# Patient Record
Sex: Male | Born: 1960 | Race: White | Hispanic: No | Marital: Married | Smoking: Former smoker
Health system: Southern US, Community
[De-identification: ages and names within clinical notes are randomized; demographics above are authoritative.]

## PROBLEM LIST (undated history)

## (undated) DIAGNOSIS — E785 Hyperlipidemia, unspecified: Secondary | ICD-10-CM

## (undated) DIAGNOSIS — K449 Diaphragmatic hernia without obstruction or gangrene: Secondary | ICD-10-CM

## (undated) DIAGNOSIS — E119 Type 2 diabetes mellitus without complications: Secondary | ICD-10-CM

## (undated) DIAGNOSIS — R0600 Dyspnea, unspecified: Secondary | ICD-10-CM

## (undated) DIAGNOSIS — D649 Anemia, unspecified: Secondary | ICD-10-CM

## (undated) DIAGNOSIS — G4730 Sleep apnea, unspecified: Secondary | ICD-10-CM

## (undated) DIAGNOSIS — R262 Difficulty in walking, not elsewhere classified: Secondary | ICD-10-CM

## (undated) DIAGNOSIS — K219 Gastro-esophageal reflux disease without esophagitis: Secondary | ICD-10-CM

## (undated) DIAGNOSIS — M1990 Unspecified osteoarthritis, unspecified site: Secondary | ICD-10-CM

## (undated) DIAGNOSIS — I10 Essential (primary) hypertension: Secondary | ICD-10-CM

## (undated) HISTORY — DX: Anemia, unspecified: D64.9

## (undated) HISTORY — PX: MOUTH SURGERY: SHX715

## (undated) HISTORY — DX: Unspecified osteoarthritis, unspecified site: M19.90

## (undated) HISTORY — DX: Dyspnea, unspecified: R06.00

## (undated) HISTORY — DX: Difficulty in walking, not elsewhere classified: R26.2

---

## 1997-06-07 ENCOUNTER — Emergency Department
Admit: 1997-06-07 | Disposition: A | Discharge status: Home / Self Care | Payer: Self-pay | Admitting: Emergency Medicine

## 2003-07-28 ENCOUNTER — Inpatient Hospital Stay
Admission: EM | Admit: 2003-07-28 | Disposition: A | Discharge status: Home / Self Care | Payer: Self-pay | Source: Emergency Department | Admitting: Surgery

## 2005-03-21 ENCOUNTER — Ambulatory Visit (INDEPENDENT_AMBULATORY_CARE_PROVIDER_SITE_OTHER): Admit: 2005-03-21 | Disposition: A | Discharge status: Home / Self Care | Payer: Self-pay | Source: Ambulatory Visit

## 2005-12-15 ENCOUNTER — Ambulatory Visit (INDEPENDENT_AMBULATORY_CARE_PROVIDER_SITE_OTHER): Admit: 2005-12-15 | Disposition: A | Discharge status: Home / Self Care | Payer: Self-pay | Source: Ambulatory Visit

## 2006-10-03 HISTORY — PX: CARDIAC CATHETERIZATION: SHX172

## 2006-11-30 ENCOUNTER — Ambulatory Visit
Admission: RE | Admit: 2006-11-30 | Disposition: A | Discharge status: Home / Self Care | Payer: Self-pay | Source: Ambulatory Visit | Admitting: Cardiovascular Disease

## 2006-11-30 LAB — CBC WITH AUTO DIFFERENTIAL CERNER
Basophils Absolute: 0.1 /mm3 (ref 0.0–0.2)
Basophils: 1 % (ref 0–2)
Eosinophils Absolute: 0.1 /mm3 (ref 0.0–0.7)
Eosinophils: 1 % (ref 0–5)
Granulocytes Absolute: 4.9 /mm3 (ref 1.8–8.1)
Hematocrit: 41.7 % — ABNORMAL LOW (ref 42.0–52.0)
Hgb: 14.5 G/DL (ref 13.0–17.0)
Lymphocytes Absolute: 2.7 /mm3 (ref 0.5–4.4)
Lymphocytes: 33 % (ref 15–41)
MCH: 32.5 PG — ABNORMAL HIGH (ref 28.0–32.0)
MCHC: 34.9 G/DL (ref 32.0–36.0)
MCV: 93.2 FL (ref 80.0–100.0)
MPV: 8.4 FL (ref 7.4–10.4)
Monocytes Absolute: 0.4 /mm3 (ref 0.0–1.2)
Monocytes: 5 % (ref 0–11)
Neutrophils %: 60 % (ref 52–75)
Platelets: 208 /mm3 (ref 140–400)
RBC: 4.47 /mm3 — ABNORMAL LOW (ref 4.70–6.00)
RDW: 13.2 % (ref 11.5–15.0)
WBC: 8.2 /mm3 (ref 3.5–10.8)

## 2006-12-01 LAB — CBC- CERNER
Hematocrit: 43.1 % (ref 42.0–52.0)
Hgb: 15.1 G/DL (ref 13.0–17.0)
MCH: 32.5 PG — ABNORMAL HIGH (ref 28.0–32.0)
MCHC: 34.9 G/DL (ref 32.0–36.0)
MCV: 93.1 FL (ref 80.0–100.0)
MPV: 8.7 FL (ref 7.4–10.4)
Platelets: 201 /mm3 (ref 140–400)
RBC: 4.63 /mm3 — ABNORMAL LOW (ref 4.70–6.00)
RDW: 13 % (ref 11.5–15.0)
WBC: 7.3 /mm3 (ref 3.5–10.8)

## 2006-12-01 LAB — BASIC METABOLIC PANEL
BUN: 10 mg/dL (ref 8–20)
CO2: 25 mEq/L (ref 21–30)
Calcium: 9 mg/dL (ref 8.6–10.2)
Chloride: 106 mEq/L (ref 98–107)
Creatinine: 0.7 mg/dL (ref 0.6–1.5)
Glucose: 82 mg/dL (ref 70–100)
Potassium: 3.8 mEq/L (ref 3.6–5.0)
Sodium: 142 mEq/L (ref 136–146)

## 2006-12-01 LAB — GFR

## 2006-12-01 LAB — PT/INR
PT INR: 1 {INR} (ref 0.9–1.1)
PT: 11.5 s (ref 10.8–13.3)

## 2006-12-02 ENCOUNTER — Emergency Department: Admit: 2006-12-02 | Payer: Self-pay | Source: Emergency Department

## 2007-01-16 DIAGNOSIS — E782 Mixed hyperlipidemia: Secondary | ICD-10-CM | POA: Insufficient documentation

## 2007-01-16 DIAGNOSIS — I219 Acute myocardial infarction, unspecified: Secondary | ICD-10-CM | POA: Insufficient documentation

## 2007-10-04 DIAGNOSIS — I219 Acute myocardial infarction, unspecified: Secondary | ICD-10-CM

## 2007-10-04 HISTORY — DX: Acute myocardial infarction, unspecified: I21.9

## 2008-03-20 DIAGNOSIS — I2510 Atherosclerotic heart disease of native coronary artery without angina pectoris: Secondary | ICD-10-CM | POA: Insufficient documentation

## 2009-04-03 ENCOUNTER — Ambulatory Visit
Admit: 2009-04-03 | Disposition: A | Discharge status: Home / Self Care | Payer: Self-pay | Source: Ambulatory Visit | Admitting: Family Medicine

## 2009-10-22 DIAGNOSIS — N529 Male erectile dysfunction, unspecified: Secondary | ICD-10-CM | POA: Insufficient documentation

## 2009-10-22 DIAGNOSIS — K219 Gastro-esophageal reflux disease without esophagitis: Secondary | ICD-10-CM | POA: Insufficient documentation

## 2011-07-09 LAB — ECG 12-LEAD
Atrial Rate: 70 {beats}/min
Atrial Rate: 71 {beats}/min
P Axis: 12 degrees
P Axis: 14 degrees
P-R Interval: 168 ms
P-R Interval: 178 ms
Q-T Interval: 410 ms
Q-T Interval: 412 ms
QRS Duration: 102 ms
QRS Duration: 110 ms
QTC Calculation (Bezet): 444 ms
QTC Calculation (Bezet): 445 ms
R Axis: 30 degrees
R Axis: 40 degrees
T Axis: 36 degrees
T Axis: 42 degrees
Ventricular Rate: 70 {beats}/min
Ventricular Rate: 71 {beats}/min

## 2011-07-21 NOTE — Op Note (Unsigned)
DATE OF BIRTH:                        28-May-1961      ADMISSION DATE:                     11/30/2006            PATIENT LOCATION:                     [facility]            DATE OF PROCEDURE:                   11/30/2006      SURGEON:                            Mardene Celeste, MD      ASSISTANT(S):                  PREOPERATIVE DIAGNOSIS:            POSTOPERATIVE DIAGNOSIS:            PROCEDURE:            INDICATIONS FOR PROCEDURE:   The patient is a 50 year old gentleman who      presented to the [facility] on 11/29/2006 with a stuttering chest pain      presentation which was associated with diffuse S-T abnormalities and      positive troponin.  The patient underwent echocardiography documenting      preserved left ventricular systolic function and was treated aggressively      with integrelin, nitroglycerin, beta blockade, and aspirin.  The patient      was also treated with Lovenox and transferred to the [facility]      on the morning of 11/30/2006 for definition of his anatomy and      consideration of coronary intervention.  The patient has a history of      possible sleep apnea, hypertension untreated, and tobacco abuse.            DESCRIPTION OF PROCEDURE:  The patient was premedicated and brought to the      cardiac catheterization laboratory.  The right inguinal area was prepped      and draped.  Utilizing aseptic technique and a single needle puncture      approach, a 6-French sheath was introduced into the right femoral artery.      A Judkins 4L 6-French left coronary atrial catheter was introduced over a      guide wire and placed proximally to engage the left main for definitive      arteriography of this system.  This catheter was removed and in a similar      fashion a 6-French Williams catheter was introduced over a guide wire and      placed proximally to engage the right main for definitive arteriography of      this system.  This catheter was removed and the study reviewed.             The right coronary artery was a large, dominant vessel coursing within the      arteriovenous groove, giving rise distally to a posterior descending branch      and posterior ventricular arcade.  It was free of significant obstructive      disease.  Left main.  This vessel was of normal length and dimensions.  It was free      of significant obstructive disease.   It gives rise to a left anterior      descending, a ramus intermedius, and a circumflex.            The LAD is a moderately sized vessel coursing down the intraventricular      groove toward the apex.  It and its ramifications are free of significant      obstructive disease  the ramus intermedius was a moderately sized vessel      coursing over the anterolateral portion of the left ventricle, and it too      was free of significant obstructive disease.  The circumflex was on      dominant but large, consisting of an arteriovenous groove branch and one      large posterolateral branch.  This vessel was noteworthy for a culprit 80%      lesion in the mid third of the vessel.            Ventriculography was not performed.            Based on the above findings, it was thought prudent to attempt intervention      upon the culprit lesion in this gentleman's non dominant circumflex.  An      additional integrelin bolus was administered and an FL4, 6-French coronary      guide was introduced and utilized to cannulate the left main.  A Prowater      Asahi wire was passed down the circumflex and over it a Maverick 15 x 3.0      angioplasty balloon was taken and deployed twice along the course of the      lesion, improving luminal flow.  This was removed and a Liberte 4.0 x 20 mm      bare metal stent was taken and deployed along the course of the lesion and      dilated on two occasions to nominal atmospheres and then to 12 atmospheres      for an effective cross section diameter of 4.24 mm.  This produced an      excellent angiographic result  and was well tolerated by the patient.            The patient underwent Angio-Seal closure and was transferred back to the      [facility] in stable condition.            FINAL IMPRESSION:   Successful placement of a non drug eluting stent/bare      metal stent, a Liberte 4.0 x 20 mm, to the mid circumflex.  This stent was      taken to 4.24 mm with an excellent angiographic result.  No other      significant lesions were noted in his coronary tree.                                          ___________________________________          Date Signed: __________      Mardene Celeste, MD  ([id number])            D: 11/30/2006 by Mardene Celeste, MD      T: 11/30/2006 by [id number] ([id number]) ([id number])      cc:  Sylvie Farrier, MD

## 2013-07-05 DIAGNOSIS — J309 Allergic rhinitis, unspecified: Secondary | ICD-10-CM | POA: Insufficient documentation

## 2016-02-08 DIAGNOSIS — E559 Vitamin D deficiency, unspecified: Secondary | ICD-10-CM | POA: Insufficient documentation

## 2016-03-26 ENCOUNTER — Ambulatory Visit: Payer: BC Managed Care – PPO

## 2016-03-26 NOTE — Pre-Procedure Instructions (Signed)
   Procedure Verified   Pt ID verified   Arrival time at 1100 w read back , parking at [employer] or [facility], check in at surgery center, pt read back   Nothing to eat and drink after midnight, no chewing gum, mints, candy, ice pt read back   Pt had labs done 03/22/16 at [facility]- spoke to [place] will fax to [phone]   Pt states he had EKG done at PCP office- spoke to [place] will fax to [phone]   Pt states he had sleep study done a few months ago, could not remember name of facility. Requested sleep study results from PCP, Melissa could no verify if they had a copy   Ride arranged with wife

## 2016-03-29 NOTE — Pre-Procedure Instructions (Addendum)
   Labs wdl 03-22-16 cbc,cmp,pt/inr. BS 101.   Ekg 6.17.2017 wdl.   Called pmd-office closed   Lvm pt for last 2 home BS to lm on 3114.   Need home BS and A1C.

## 2016-03-31 NOTE — Pre-Procedure Instructions (Signed)
Patient called in to the 3114 and left a message that he does not do accuchecks at home since he is "boarderline"  Last A1C was 6.4.

## 2016-04-14 ENCOUNTER — Encounter: Payer: Self-pay | Admitting: Anesthesiology

## 2016-04-15 ENCOUNTER — Encounter
Admission: RE | Disposition: A | Discharge status: Home / Self Care | Payer: Self-pay | Source: Ambulatory Visit | Attending: Otolaryngology

## 2016-04-15 ENCOUNTER — Ambulatory Visit: Payer: BC Managed Care – PPO | Admitting: Otolaryngology

## 2016-04-15 ENCOUNTER — Ambulatory Visit
Admission: RE | Admit: 2016-04-15 | Discharge: 2016-04-15 | Disposition: A | Discharge status: Home / Self Care | Payer: BC Managed Care – PPO | Source: Ambulatory Visit | Attending: Otolaryngology | Admitting: Otolaryngology

## 2016-04-15 HISTORY — DX: Essential (primary) hypertension: I10

## 2016-04-15 HISTORY — DX: Hyperlipidemia, unspecified: E78.5

## 2016-04-15 HISTORY — DX: Diaphragmatic hernia without obstruction or gangrene: K44.9

## 2016-04-15 HISTORY — DX: Gastro-esophageal reflux disease without esophagitis: K21.9

## 2016-04-15 HISTORY — DX: Type 2 diabetes mellitus without complications: E11.9

## 2016-04-15 HISTORY — DX: Sleep apnea, unspecified: G47.30

## 2016-04-15 SURGERY — ENDOSCOPY NASAL
Anesthesia: Anesthesia General

## 2016-04-15 SURGICAL SUPPLY — 51 items
BANDAGE STERI-STRIP 0.5X4IN (Dressing) ×1
BLADE INFERIOR TURBINATE 2MM (ENT Supplies) ×1
BLADE SHAVER OD2 MM STRAIGHT SHAFT (ENT Supplies) ×1
BLADE SHAVER OD2 MM STRAIGHT SHAFT ELEVATOR IRRIGATION TUBE (ENT Supplies) ×1 IMPLANT
BLADE SURG SAFETYLOCK STRL 15 (Blade) ×2 IMPLANT
COAGULATOR HANDSWITCHNG 8F 6IN (Cautery) ×1
COAGULATOR SUCTION L6 IN HANDSWITCH CORD (Cautery) ×1
COAGULATOR SUCTION L6 IN HANDSWITCH CORD THERMAL REDUCTION FLUID (Cautery) ×1 IMPLANT
DRESSING SURG PATTY .5X3IN (Sponge) ×2 IMPLANT
GLOVE SURG BIOGEL SENSE SZ 7.5 (Glove) ×4 IMPLANT
GLOVE SURG BIOGEL SZ7.5 (Glove) ×4 IMPLANT
HRST DONUT HL-IN-ONE (Positioning Supplies) ×2 IMPLANT
INACTIVE USE LAWSON 120900 (Gown) ×2 IMPLANT
NEEDLE 27G X 1/2" (Needles) ×2 IMPLANT
PACK HEAD AND NECK (Drape) ×2
PACK SRG LF HD NK (Drape) ×2
PACK SURGICAL HEAD NECK CONVERTORS (Drape) ×2 IMPLANT
PAD ARMBOARD 20X8X2IN (Procedure Accessories) ×2 IMPLANT
PAD ELECTROSRG GRND REM W CRD (Procedure Accessories) ×2 IMPLANT
SOL NACL .9% 500ML LATEX (IV Solutions) ×1
SOL NACL .9% IRRIG 250ML NLTX (IV Solutions) ×1
SOL NACL.9% 1000ML IRR NONLTX (Irrigation Solutions) ×1
SOLUTION IRRIGATION 0.9% SODIUM CHLORIDE (IV Solutions) ×1
SOLUTION IRRIGATION 0.9% SODIUM CHLORIDE (Irrigation Solutions) ×1
SOLUTION IRRIGATION 0.9% SODIUM CHLORIDE 1000 ML PLASTIC POUR BOTTLE (Irrigation Solutions) ×1 IMPLANT
SOLUTION IRRIGATION 0.9% SODIUM CHLORIDE 250 ML PLASTIC POUR BOTTLE (IV Solutions) ×1 IMPLANT
SOLUTION IV 0.9% SODIUM CHLORIDE 500 ML (IV Solutions) ×1
SOLUTION IV 0.9% SODIUM CHLORIDE 500 ML PLASTIC CONTAINER (IV Solutions) ×1 IMPLANT
SPLINT DOYLE II INTRANASAL (Splint) ×2 IMPLANT
SPLINT NASAL L3.5 CM X H2 CM THK.6 CM (Sponge) ×1
SPLINT NASAL L3.5 CM X H2 CM THK.6 CM KENNEDY SEPTUM COMPRESSED (Sponge) ×1 IMPLANT
SPONGE MEROCEL KENNDY 3.5CM (Sponge) ×1
STRIP SKIN CLOSURE L4 IN X W1/2 IN (Dressing) ×1
STRIP SKIN CLOSURE L4 IN X W1/2 IN REINFORCE STERI-STRIP POLYESTER (Dressing) ×1 IMPLANT
SUTURE CHROMIC 4-0 P3 18IN (Suture) ×1
SUTURE CHROMIC 4-0 SC1 SC118IN (Suture)
SUTURE CHROMIC GUT CHROMIC 4-0 P-3 L18 (Suture) ×1 IMPLANT
SUTURE ETHILON 3-0 PS1 18IN (Suture) ×1
SUTURE ETHILON BLACK 3-0 PS-1 L18 IN (Suture) ×1
SUTURE ETHILON BLACK 3-0 PS-1 L18 IN MONOFILAMENT NONABSORBABLE (Suture) ×1 IMPLANT
SUTURE PLAIN GUT PLAIN 4-0 SC-1 L18 IN 2 (Suture)
SUTURE PLAIN GUT PLAIN 4-0 SC-1 L18 IN 2 ARM MONOFILAMENT YELLOWISH (Suture) IMPLANT
SYRINGE BULB 60CC LATEX FREE (Syringes, Needles) ×2 IMPLANT
SYRINGE EAR/ULCER GREEN 3 OZ (Syringes, Needles) ×2 IMPLANT
TOWEL STERILE 6-PACK (Procedure Accessories) ×2 IMPLANT
TRAY BASIN MINOR (Tray) ×2 IMPLANT
TRAY NASAL/ORAL/H (Tray) ×2 IMPLANT
TUBE CULTURE STURTS LIQ BBL (Procedure Accessories) ×4 IMPLANT
TUBING CONNECTING STERILE 10FT (Tubing) ×2
TUBING SUCTION ID3/16 IN L10 FT (Tubing) ×2
TUBING SUCTION ID3/16 IN L10 FT NONCONDUCTIVE STRAIGHT MALE FEMALE (Tubing) ×2 IMPLANT

## 2016-04-29 ENCOUNTER — Ambulatory Visit: Payer: BC Managed Care – PPO

## 2016-04-29 DIAGNOSIS — I119 Hypertensive heart disease without heart failure: Secondary | ICD-10-CM | POA: Insufficient documentation

## 2016-04-29 DIAGNOSIS — R809 Proteinuria, unspecified: Secondary | ICD-10-CM | POA: Insufficient documentation

## 2016-04-29 NOTE — Pre-Procedure Instructions (Addendum)
   Labs and ekg 03/22/16 in chart reviewed   PMD LOV in chart; pt has appt w PMD today @ 2p for ff up after being started on Metformin    Requested results of HA1C from PMD - fax sent   Previously scheduled surgery (04/15/16) was cancelled d/t pt drinking protein shake in the morning DOS   Pt stated, per surgeon, he will hold Aspirin 1 week before DOS   Requested LOV from cardiologist - Wynonia Musty MD - fax sent

## 2016-05-03 NOTE — Pre-Procedure Instructions (Signed)
   Pt had labs- cmp and a1c at [facility] 04/22/16, called and they will fax to PSS

## 2016-05-06 NOTE — Pre-Procedure Instructions (Signed)
[facility] Nav - DOS 05/13/16  Requested document review.   Labs of 04/22/16 were received (cmp, hga1c)   Old cardiac records in epic - no visit with cardiologist (per interview, records, and med clearance) since 2008 with Dr Graciella Belton.   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   Call placed to Dr Rockey Situ office, spoke to [employer] (scheduler not in). Courtesy call to advise that we will ask anesthesia to eval case due to hx and no cardiology eval since 2008, even with med clearance, due to hx and co-morbidities, to ensure optimization for surgery.   H/o to Nav 1 for anesthesia review.

## 2016-05-09 ENCOUNTER — Encounter: Payer: Self-pay | Admitting: Anesthesiology

## 2016-05-09 NOTE — Anesthesia Preprocedure Evaluation (Addendum)
Anesthesia Evaluation    AIRWAY    Mallampati: II    TM distance: >3 FB  Neck ROM: full  Mouth Opening:full   CARDIOVASCULAR    cardiovascular exam normal       DENTAL         PULMONARY    pulmonary exam normal     OTHER FINDINGS    55 y.o. Man for nasal endoscopy, septoplasty SMR  And tubinates with Dr. Debbra Riding on 05/13/16.  Followed by PCP with stable medical problems including, but not limited to, MI (2008) NSTEMI followed by stents (s/p trauma), OSA, DM type II, HTN and elevated cholesterol.. EKG notable for ST , otherwise normal.  METS 9.  No further work up required.  CW                Anesthesia Plan    ASA 3     general                                 informed consent obtained

## 2016-05-13 ENCOUNTER — Ambulatory Visit: Payer: BC Managed Care – PPO | Admitting: Anesthesiology

## 2016-05-13 ENCOUNTER — Encounter
Admission: RE | Disposition: A | Discharge status: Home / Self Care | Payer: Self-pay | Source: Ambulatory Visit | Attending: Otolaryngology

## 2016-05-13 ENCOUNTER — Ambulatory Visit
Admission: RE | Admit: 2016-05-13 | Discharge: 2016-05-13 | Disposition: A | Discharge status: Home / Self Care | Payer: BC Managed Care – PPO | Source: Ambulatory Visit | Attending: Otolaryngology | Admitting: Otolaryngology

## 2016-05-13 ENCOUNTER — Ambulatory Visit: Payer: BC Managed Care – PPO | Admitting: Otolaryngology

## 2016-05-13 ENCOUNTER — Other Ambulatory Visit: Payer: Self-pay

## 2016-05-13 DIAGNOSIS — G4733 Obstructive sleep apnea (adult) (pediatric): Secondary | ICD-10-CM | POA: Insufficient documentation

## 2016-05-13 DIAGNOSIS — J342 Deviated nasal septum: Secondary | ICD-10-CM | POA: Insufficient documentation

## 2016-05-13 DIAGNOSIS — E119 Type 2 diabetes mellitus without complications: Secondary | ICD-10-CM | POA: Insufficient documentation

## 2016-05-13 DIAGNOSIS — K219 Gastro-esophageal reflux disease without esophagitis: Secondary | ICD-10-CM | POA: Insufficient documentation

## 2016-05-13 DIAGNOSIS — I252 Old myocardial infarction: Secondary | ICD-10-CM | POA: Insufficient documentation

## 2016-05-13 DIAGNOSIS — I10 Essential (primary) hypertension: Secondary | ICD-10-CM | POA: Insufficient documentation

## 2016-05-13 DIAGNOSIS — E785 Hyperlipidemia, unspecified: Secondary | ICD-10-CM | POA: Insufficient documentation

## 2016-05-13 DIAGNOSIS — J343 Hypertrophy of nasal turbinates: Secondary | ICD-10-CM | POA: Insufficient documentation

## 2016-05-13 HISTORY — PX: SEPTOPLASTY SMR, TURBINATES: SHX5535

## 2016-05-13 HISTORY — PX: ENDOSCOPY NASAL: SHX5831

## 2016-05-13 LAB — GLUCOSE WHOLE BLOOD - POCT
Whole Blood Glucose POCT: 122 mg/dL — ABNORMAL HIGH (ref 70–100)
Whole Blood Glucose POCT: 129 mg/dL — ABNORMAL HIGH (ref 70–100)

## 2016-05-13 SURGERY — ENDOSCOPY NASAL
Anesthesia: Anesthesia General | Site: Throat | Wound class: Clean Contaminated

## 2016-05-13 MED ORDER — OXYMETAZOLINE HCL 0.05 % NA SOLN
NASAL | Status: DC | PRN
Start: 2016-05-13 — End: 2016-05-13
  Administered 2016-05-13: 10 mL via NASAL

## 2016-05-13 MED ORDER — PROPOFOL 10 MG/ML IV EMUL (WRAP)
INTRAVENOUS | Status: AC
Start: 2016-05-13 — End: ?
  Filled 2016-05-13: qty 50

## 2016-05-13 MED ORDER — COCAINE HCL 4 % EX SOLN
CUTANEOUS | Status: DC
Start: 2016-05-13 — End: 2016-05-13
  Filled 2016-05-13: qty 4

## 2016-05-13 MED ORDER — DIPHENHYDRAMINE HCL 50 MG/ML IJ SOLN
12.5000 mg | Freq: Four times a day (QID) | INTRAMUSCULAR | Status: DC | PRN
Start: 2016-05-13 — End: 2016-05-13

## 2016-05-13 MED ORDER — ONDANSETRON HCL 4 MG/2ML IJ SOLN
4.0000 mg | Freq: Once | INTRAMUSCULAR | Status: DC | PRN
Start: 2016-05-13 — End: 2016-05-13

## 2016-05-13 MED ORDER — FENTANYL CITRATE (PF) 50 MCG/ML IJ SOLN (WRAP)
INTRAMUSCULAR | Status: AC
Start: 2016-05-13 — End: ?
  Filled 2016-05-13: qty 2

## 2016-05-13 MED ORDER — PROPOFOL 10 MG/ML IV EMUL (WRAP)
INTRAVENOUS | Status: DC | PRN
Start: 2016-05-13 — End: 2016-05-13
  Administered 2016-05-13 (×2): 100 mg via INTRAVENOUS

## 2016-05-13 MED ORDER — FENTANYL CITRATE (PF) 50 MCG/ML IJ SOLN (WRAP)
INTRAMUSCULAR | Status: DC | PRN
Start: 2016-05-13 — End: 2016-05-13
  Administered 2016-05-13 (×4): 25 ug via INTRAVENOUS

## 2016-05-13 MED ORDER — PROPOFOL 10 MG/ML IV EMUL (WRAP)
INTRAVENOUS | Status: AC
Start: 2016-05-13 — End: ?
  Filled 2016-05-13: qty 20

## 2016-05-13 MED ORDER — LACTATED RINGERS IV SOLN
50.0000 mL/h | INTRAVENOUS | Status: DC
Start: 2016-05-13 — End: 2016-05-13
  Administered 2016-05-13: 50 mL/h via INTRAVENOUS

## 2016-05-13 MED ORDER — MUPIROCIN 2 % EX OINT
TOPICAL_OINTMENT | CUTANEOUS | Status: DC | PRN
Start: 2016-05-13 — End: 2016-05-13
  Administered 2016-05-13: 1 via TOPICAL

## 2016-05-13 MED ORDER — FAMOTIDINE 10 MG/ML IV SOLN (WRAP)
INTRAVENOUS | Status: DC | PRN
Start: 2016-05-13 — End: 2016-05-13
  Administered 2016-05-13: 20 mg via INTRAVENOUS

## 2016-05-13 MED ORDER — LABETALOL HCL 5 MG/ML IV SOLN
INTRAVENOUS | Status: AC
Start: 2016-05-13 — End: ?
  Filled 2016-05-13: qty 20

## 2016-05-13 MED ORDER — LIDOCAINE-EPINEPHRINE 1.5 %-1:200000 IJ SOLN
INTRAMUSCULAR | Status: DC | PRN
Start: 2016-05-13 — End: 2016-05-13
  Administered 2016-05-13: 6.6 mL

## 2016-05-13 MED ORDER — OXYCODONE-ACETAMINOPHEN 5-325 MG PO TABS
ORAL_TABLET | ORAL | Status: AC
Start: 2016-05-13 — End: 2016-05-13
  Administered 2016-05-13: 1 via ORAL
  Filled 2016-05-13: qty 1

## 2016-05-13 MED ORDER — LABETALOL HCL 5 MG/ML IV SOLN
INTRAVENOUS | Status: DC | PRN
Start: 2016-05-13 — End: 2016-05-13
  Administered 2016-05-13: 5 mg via INTRAVENOUS
  Administered 2016-05-13: 10 mg via INTRAVENOUS
  Administered 2016-05-13: 15 mg via INTRAVENOUS

## 2016-05-13 MED ORDER — COCAINE HCL 4 % EX SOLN
CUTANEOUS | Status: DC | PRN
Start: 2016-05-13 — End: 2016-05-13
  Administered 2016-05-13: 4 mL via TOPICAL

## 2016-05-13 MED ORDER — CLINDAMYCIN HCL 300 MG PO CAPS
300.0000 mg | ORAL_CAPSULE | Freq: Three times a day (TID) | ORAL | 0 refills | Status: DC
Start: 2016-05-13 — End: 2018-01-18
  Filled 2016-05-13: qty 21, 7d supply, fill #0

## 2016-05-13 MED ORDER — HYDROMORPHONE HCL 1 MG/ML IJ SOLN
0.5000 mg | INTRAMUSCULAR | Status: DC | PRN
Start: 2016-05-13 — End: 2016-05-13

## 2016-05-13 MED ORDER — ACETAMINOPHEN 325 MG PO TABS
650.0000 mg | ORAL_TABLET | Freq: Once | ORAL | Status: DC | PRN
Start: 2016-05-13 — End: 2016-05-13

## 2016-05-13 MED ORDER — METOCLOPRAMIDE HCL 5 MG/ML IJ SOLN
10.0000 mg | Freq: Once | INTRAMUSCULAR | Status: DC | PRN
Start: 2016-05-13 — End: 2016-05-13

## 2016-05-13 MED ORDER — FAMOTIDINE 20 MG/2ML IV SOLN
INTRAVENOUS | Status: AC
Start: 2016-05-13 — End: ?
  Filled 2016-05-13: qty 2

## 2016-05-13 MED ORDER — LIDOCAINE HCL (PF) 4 % IJ SOLN
INTRAMUSCULAR | Status: AC
Start: 2016-05-13 — End: ?
  Filled 2016-05-13: qty 5

## 2016-05-13 MED ORDER — FENTANYL CITRATE (PF) 50 MCG/ML IJ SOLN (WRAP)
25.0000 ug | INTRAMUSCULAR | Status: AC | PRN
Start: 2016-05-13 — End: 2016-05-13
  Administered 2016-05-13 (×3): 25 ug via INTRAVENOUS

## 2016-05-13 MED ORDER — OXYMETAZOLINE HCL 0.05 % NA SOLN
NASAL | Status: AC
Start: 2016-05-13 — End: ?
  Filled 2016-05-13: qty 15

## 2016-05-13 MED ORDER — MEPERIDINE HCL 25 MG/ML IJ SOLN
25.0000 mg | Freq: Once | INTRAMUSCULAR | Status: DC | PRN
Start: 2016-05-13 — End: 2016-05-13

## 2016-05-13 MED ORDER — PROPOFOL INFUSION 10 MG/ML
INTRAVENOUS | Status: DC | PRN
Start: 2016-05-13 — End: 2016-05-13
  Administered 2016-05-13: 120 ug/kg/min via INTRAVENOUS

## 2016-05-13 MED ORDER — ONDANSETRON HCL 4 MG/2ML IJ SOLN
INTRAMUSCULAR | Status: AC
Start: 2016-05-13 — End: ?
  Filled 2016-05-13: qty 2

## 2016-05-13 MED ORDER — OXYCODONE-ACETAMINOPHEN 5-325 MG PO TABS
1.0000 | ORAL_TABLET | Freq: Once | ORAL | Status: AC | PRN
Start: 2016-05-13 — End: 2016-05-13

## 2016-05-13 MED ORDER — HYDROMORPHONE HCL 1 MG/ML IJ SOLN
INTRAMUSCULAR | Status: DC
Start: 2016-05-13 — End: 2016-05-13
  Filled 2016-05-13: qty 1

## 2016-05-13 MED ORDER — SODIUM CHLORIDE 0.9% BAG (IRRIGATION USE)
INTRAVENOUS | Status: DC | PRN
Start: 2016-05-13 — End: 2016-05-13
  Administered 2016-05-13: 1000 mL

## 2016-05-13 MED ORDER — ONDANSETRON HCL 4 MG/2ML IJ SOLN
INTRAMUSCULAR | Status: DC | PRN
Start: 2016-05-13 — End: 2016-05-13
  Administered 2016-05-13: 4 mg via INTRAVENOUS

## 2016-05-13 MED ORDER — OXYCODONE-ACETAMINOPHEN 5-325 MG PO TABS
1.0000 | ORAL_TABLET | ORAL | 0 refills | Status: DC | PRN
Start: 2016-05-13 — End: 2018-01-18
  Filled 2016-05-13: qty 40, 7d supply, fill #0

## 2016-05-13 MED ORDER — MUPIROCIN 2 % EX OINT
TOPICAL_OINTMENT | CUTANEOUS | Status: AC
Start: 2016-05-13 — End: ?
  Filled 2016-05-13: qty 22

## 2016-05-13 MED ORDER — LACTATED RINGERS IV SOLN
INTRAVENOUS | Status: DC
Start: 2016-05-13 — End: 2016-05-13
  Administered 2016-05-13: 1000 mL via INTRAVENOUS

## 2016-05-13 MED ORDER — FENTANYL CITRATE (PF) 50 MCG/ML IJ SOLN (WRAP)
INTRAMUSCULAR | Status: AC
Start: 2016-05-13 — End: 2016-05-13
  Administered 2016-05-13: 25 ug via INTRAVENOUS
  Filled 2016-05-13: qty 2

## 2016-05-13 SURGICAL SUPPLY — 51 items
BANDAGE STERI-STRIP 0.5X4IN (Dressing) ×1
BLADE INFERIOR TURBINATE 2MM (ENT Supplies) ×1
BLADE SHAVER OD2 MM STRAIGHT SHAFT (ENT Supplies) ×2
BLADE SHAVER OD2 MM STRAIGHT SHAFT ELEVATOR IRRIGATION TUBE (ENT Supplies) ×2 IMPLANT
BLADE SURG SAFETYLOCK STRL 15 (Blade) ×3 IMPLANT
COAGULATOR HANDSWITCHNG 8F 6IN (Cautery) ×1
COAGULATOR SUCTION L6 IN HANDSWITCH CORD (Cautery) ×2
COAGULATOR SUCTION L6 IN HANDSWITCH CORD THERMAL REDUCTION FLUID (Cautery) ×2 IMPLANT
DRESSING SURG PATTY .5X3IN (Sponge) ×3 IMPLANT
GLOVE SURG BIOGEL SENSE SZ 7.5 (Glove) ×6 IMPLANT
GLOVE SURG BIOGEL SZ7.5 (Glove) ×6 IMPLANT
HRST DONUT HL-IN-ONE (Positioning Supplies) ×3 IMPLANT
INACTIVE USE LAWSON 120900 (Gown) ×3 IMPLANT
NEEDLE 27G X 1/2" (Needles) ×3 IMPLANT
PACK HEAD AND NECK (Drape) ×2
PACK SRG LF HD NK (Drape) ×4
PACK SURGICAL HEAD NECK CONVERTORS (Drape) ×4 IMPLANT
PAD ARMBOARD 20X8X2IN (Procedure Accessories) ×3 IMPLANT
PAD ELECTROSRG GRND REM W CRD (Procedure Accessories) ×3 IMPLANT
SOL NACL .9% 500ML LATEX (IV Solutions) ×1
SOL NACL .9% IRRIG 250ML NLTX (IV Solutions) ×1
SOL NACL.9% 1000ML IRR NONLTX (Irrigation Solutions) ×1
SOLUTION IRRIGATION 0.9% SODIUM CHLORIDE (IV Solutions) ×2
SOLUTION IRRIGATION 0.9% SODIUM CHLORIDE (Irrigation Solutions) ×2
SOLUTION IRRIGATION 0.9% SODIUM CHLORIDE 1000 ML PLASTIC POUR BOTTLE (Irrigation Solutions) ×2 IMPLANT
SOLUTION IRRIGATION 0.9% SODIUM CHLORIDE 250 ML PLASTIC POUR BOTTLE (IV Solutions) ×2 IMPLANT
SOLUTION IV 0.9% SODIUM CHLORIDE 500 ML (IV Solutions) ×2
SOLUTION IV 0.9% SODIUM CHLORIDE 500 ML PLASTIC CONTAINER (IV Solutions) ×2 IMPLANT
SPLINT DOYLE II INTRANASAL (Splint) ×3 IMPLANT
SPLINT NASAL L3.5 CM X H2 CM THK.6 CM (Sponge) ×2
SPLINT NASAL L3.5 CM X H2 CM THK.6 CM KENNEDY SEPTUM COMPRESSED (Sponge) ×2 IMPLANT
SPONGE MEROCEL KENNDY 3.5CM (Sponge) ×1
STRIP SKIN CLOSURE L4 IN X W1/2 IN (Dressing) ×2
STRIP SKIN CLOSURE L4 IN X W1/2 IN REINFORCE STERI-STRIP POLYESTER (Dressing) ×2 IMPLANT
SUTURE CHROMIC 4-0 P3 18IN (Suture) ×1
SUTURE CHROMIC 4-0 SC1 SC118IN (Suture)
SUTURE CHROMIC GUT CHROMIC 4-0 P-3 L18 (Suture) ×2 IMPLANT
SUTURE ETHILON 3-0 PS1 18IN (Suture) ×1
SUTURE ETHILON BLACK 3-0 PS-1 L18 IN (Suture) ×2
SUTURE ETHILON BLACK 3-0 PS-1 L18 IN MONOFILAMENT NONABSORBABLE (Suture) ×2 IMPLANT
SUTURE PLAIN GUT PLAIN 4-0 SC-1 L18 IN 2 (Suture)
SUTURE PLAIN GUT PLAIN 4-0 SC-1 L18 IN 2 ARM MONOFILAMENT YELLOWISH (Suture) IMPLANT
SYRINGE BULB 60CC LATEX FREE (Syringes, Needles) ×3 IMPLANT
SYRINGE EAR/ULCER GREEN 3 OZ (Syringes, Needles) ×3 IMPLANT
TOWEL STERILE 6-PACK (Procedure Accessories) ×3 IMPLANT
TRAY BASIN MINOR (Tray) ×3 IMPLANT
TRAY NASAL/ORAL/H (Tray) ×3 IMPLANT
TUBE CULTURE STURTS LIQ BBL (Procedure Accessories) ×6 IMPLANT
TUBING CONNECTING STERILE 10FT (Tubing) ×2
TUBING SUCTION ID3/16 IN L10 FT (Tubing) ×4
TUBING SUCTION ID3/16 IN L10 FT NONCONDUCTIVE STRAIGHT MALE FEMALE (Tubing) ×4 IMPLANT

## 2016-05-13 NOTE — Transfer of Care (Signed)
Anesthesia Transfer of Care Note    Patient: Vincent Miller    Procedures performed: Procedure(s) with comments:  ENDOSCOPY NASAL - DISE, NASAL ENDOSCOPY, SEPTO, TURBS*       SEPTOPLASTY SMR, TURBINATES    Anesthesia type: General LMA    Patient location:Phase I PACU    Last vitals:   Filed Vitals:    05/13/16 1049   BP:    Pulse: 90   Temp: 36.4 C (97.5 F)   Resp: 16   SpO2: 100%       Post pain: Patient not complaining of pain, continue current therapy      Mental Status:awake    Respiratory Function: tolerating face mask    Cardiovascular: stable    Nausea/Vomiting: patient not complaining of nausea or vomiting    Hydration Status: adequate    Post assessment: no apparent anesthetic complications    Signed by: Marylynn Pearson  05/13/2016 10:50 AM

## 2016-05-13 NOTE — Discharge Instructions (Addendum)
Nasal Surgery: Septoplasty  You're scheduled to have nasal surgery. The type of nasal surgery you're having is called septoplasty. Read on to learn more about what to expect during this surgery.      During surgery, the surgeon may remove cartilage and boneto reshape the deviated septum. After surgery, there is more breathing space. Enough cartilage and bone remain to give the nose support.    What to expect during septoplasty  This surgery repairs a blockage inside the nose caused by a deviated septum. With a deviated septum, there is a problem with the wall that divides the nose into two chambers. A deviated septum may block air coming through one or both nostrils. This makes it harder for you to breathe through your nose. During septoplasty, the surgeon makesincisionsinside the nose. Then the surgeon trims, reshapes, moves, or removescartilage and sometimes bone from the septum.  Risks and possible complications  As with any surgery, nasal surgery has some risks. These include a slight risk of bleeding and infection. Your doctor will discuss any other risks and complications with you.   After septoplasty  After septoplasty, you'll be taken to a recovery area or to your hospital room. Your experience may be as follows:   You may have packing material inside your nose. This reduces bleeding and promotes healing. You may also have bandages (dressings) on the outside of your nose.   It's normal to have some mucus and blood drain from your nose. Until packing is removed, you may have to breathe through your mouth.   You may have some swelling or bruising around the eyelids if a rhinoplasty was also done.   Expect some throat dryness and irritation.   Pain medicine will be prescribed as needed. Don't take medicine that contains aspirin or ibuprofen. These can cause increased bleeding.  Follow-up care  You'll need to follow up with your doctor within a week after your surgery. Here is what to expect:   Any  packing, splint, or dressings will probably be removed. You may feel slight discomfort and bleed a little when this is done.   After the splint or packing is removed, you'll most likely breathe better than you did before surgery.   You may have minor numbness, pain, swelling, and a little stiffness under the tip of the nose.   In a few days, the inside of your nose may swell and briefly block your breathing. Or a scab or crust may block breathing for a short time. Leave the scab alone. Your doctor can remove it. Using saline (irrigation or aerosol) regularly after surgery helps to reduce the amount of crusting at each visit.   Contact your healthcare provider if you have any questions or concerns.  Date Last Reviewed: 08/04/2015   2000-2016 [employer] [address] [place], [place] [address]. All rights reserved. This information is not intended as a substitute for professional medical care. Always follow your healthcare professional's instructions.        Anesthesia: After Your Surgery  You've just had surgery. During surgery, you received medication called anesthesia to keep you comfortable and pain-free. After surgery, you may experience some pain or nausea. This is normal. Here are some tips for feeling better and recovering after surgery.  Going Home  Your doctor or nurse will show you how to take care of yourself when you go home. He or she will also answer your questions. Have an adult family member or friend drive you home.  For the first 24 hours after your surgery:   Do not drive or use heavy equipment.   Do not make important decisions or sign legal documents.   Avoid alcohol.   Have someone stay with you, if needed. He or she can watch for problems and help keep you safe.  Be sure to keep all follow-up doctor's appointments. And rest after your procedure for as long as your doctor tells you to.  Coping with Pain  If you have pain after surgery, pain medication will help you feel  better. Take it as directed, before pain becomes severe. Also, ask your doctor or pharmacist about other ways to control pain, such as with heat, ice, and relaxation. And follow any other instructions your surgeon or nurse gives you.  Tips for Taking Pain Medication  To get the best relief possible, remember these points:   Pain medications can upset your stomach. Taking them with a little food may help.   Most pain relievers taken by mouth need at least 20 to 30 minutes to take effect.   Taking medication on a schedule can help you remember to take it. Try to time your medication so that you can take it before beginning an activity, such as dressing, walking, or sitting down for dinner.   Constipation is a common side effect of pain medications. Contact your doctor before taking any medications like laxatives or stool softeners to help relieve constipation. Also ask about any dietary restrictions, because drinkinglots of fluids andeating foodslikefruits and vegetables that are high in fiber can also help. Remember, don't take laxatives unless your surgeon has prescribed them.   Mixing alcohol and pain medication can cause dizziness and slow your breathing. It can even be fatal. Don't drink alcohol while taking pain medication.   Pain medication can slow your reflexes. Don't drive or operate machinery while taking pain medication.  If your health care provider advises you to take acetaminophen, the generic name for Tylenol and other brand-name pain relievers, to help relieve your pain, ask for a daily dose. Remember that acetaminophen or other pain relievers may interact with prescription medicines or other over-the-counter (OTC) drugs. The FDA recommends reading OTC medication labels carefully to clearly understand the list of active ingredients, directions, and any precautions to help avoid taking too muchacetaminophen. If you have questions, ask your pharmacist or health care provider.  Managing  Nausea  Some people have an upset stomach after surgery. This is often due to anesthesia, pain, pain medications, or the stress of surgery. The following tips will help you manage nausea and get good nutrition as you recover. If you were on a special diet before surgery, ask your doctor if you should follow it during recovery. These tips may help:   Don't push yourself to eat. Your body will tell you what to eat and when.   Start off with clear liquids and soup. They are easier to digest.   Progress to semisolids (mashed potatoes, applesauce, and gelatin) as you feel ready.   Slowly move to solid foods. Don't eat fatty, rich, or spicy foods at first.   Don't force yourself to have three large meals a day. Instead, eat smaller amounts more often.   Take pain medications with a small amount of solid food, such as crackers or toast to avoid nausea.  Call Your Surgeon If.   You still have pain an hour after taking medication (it may not be strong enough).   You feel too sleepy,  dizzy, or groggy (medication may be too strong).   You have side effects like nausea, vomiting, or skin changes (rash, itching, or hives).    [address], [place], [place] [address]. All rights reserved. This information is not intended as a substitute for professional medical care. Always follow your healthcare professional's instructions.

## 2016-05-13 NOTE — Anesthesia Postprocedure Evaluation (Signed)
Anesthesia Post Evaluation    Patient: Vincent Miller    Procedure(s) with comments:  ENDOSCOPY NASAL - DISE, NASAL ENDOSCOPY, SEPTO, TURBS*       SEPTOPLASTY SMR, TURBINATES    Anesthesia type: general    Last Vitals:   Filed Vitals:    05/13/16 1140   BP: 168/90   Pulse: 82   Temp:    Resp: 16   SpO2: 97%       Patient Location: PACU      Post Pain: Patient not complaining of pain, continue current therapy    Mental Status: awake and alert    Respiratory Function: tolerating room air    Cardiovascular: stable    Nausea/Vomiting: patient not complaining of nausea or vomiting    Hydration Status: adequate    Post Assessment: no apparent anesthetic complications, no evidence of recall and no reportable events          Anesthesia Qualified Clinical Data Registry    Central Line      CVC insertion : NO                                               Perioperative temperature management      General/neuraxial anesthesia > or = 60 minutes (excluding CABG) : YES              > Use of intraoperative active warming : YES              > Temperature > or = 36 degrees Centigrade (96.8 degrees Farenheit) during time span from 30 minutes before up to 15 minutes after anesthesia end time : YES      Administration of antibiotic prophylaxis      Age > or = 18, with IV access, with surgical procedure for which antibiotic prophylaxis indicated, and not on chronic antibiotics : YES              > Prophylactic antibiotics within 1 hour of incision (or fluroroquinolone/vancomycin within 2 hours of incision) : YES    Medication Administration      Ordering or administration of drug inconsistent with intended drug, dose, delivery or timing : NO      Dental loss/damage      Dental injury with administration of anesthesia : NO      Difficult intubation due to unrecognized difficult airway        Elective airway procedure including but not limited to: tracheostomy, fiberoptic bronchoscopy, rigid bronchoscopy; jet ventilation; or elective  use of a device to facilitate airway management such as a Glidescope : NO                > Unanticipated difficult intubation post pre-evaluation : NO      Aspiration of gastric contents        Aspiration of gastric contents : NO                    Surgical fire        Procedure requiring electrocautery/laser : YES                > Ignition/burning in invasive procedure location : NO      Immediate perioperative cardiac arrest        Cardiac arrest in OR or PACU : NO  Unplanned hospital admission        Unplanned hospital admission for initially intended outpatient anesthesia service : NO      Unplanned ICU admission        Unplanned ICU admission related to anesthesia occurring within 24 hours of induction or start of MAC : NO      Surgical case cancellation        Cancellation of procedure after care already started by anesthesia care team : NO      Post-anesthesia transfer of care checklist/protocol to PACU        Transfer from OR to PACU upon case conclusion : YES              > Use of PACU transfer checklist/protocol : YES     (Includes the key elements of: patient identification, responsible practitioner identification (PACU nurse or advanced practitioner), discussion of pertinent history and procedure course, intraoperative anesthetic management, post-procedure plans, acknowledgement/questions)    Post-anesthesia transfer of care checklist/protocol to ICU        Transfer from OR to ICU upon case conclusion : NO                    Post-operative nausea/vomiting risk protocol        Post-operative nausea/vomiting risk protocol : YES  Patient > or = 18 with care initiated by anesthesia team that has a risk factor screen for post-op nausea/vomiting (Includes male, hx PONV, or motion sickness, non-smoker, intended opioid administration for post-op analgesia.)    Anaphylaxis        Anaphylaxis during anesthesia services : NO    (Inclusive of any suspected transfusion reaction in association with  blood-bank confirmed blood product incompatibility)              Dyanne Carrel, 05/13/2016 12:00 PM

## 2016-05-13 NOTE — H&P (Signed)
Vincent Miller is a 55 y.o. male with obstructive sleep apnea and cpap intolerance.  + nasal obstruction and septal deviation and turbinate hypertrophy.      Past Medical History   Diagnosis Date   . Hypertension    . Hyperlipidemia    . Myocardial infarction 2009     caused by trauma during cpr   . Type 2 diabetes mellitus, controlled      borderline; does not check glucose @ home   . Gastroesophageal reflux disease    . Hiatal hernia    . Sleep apnea      sleep study done recently, currently not using a cpap   . Paroxysmal nocturnal dyspnea      d/t deflected nasal septum   . Anemia    . Difficulty walking      d/t ankle pain        Exam   Filed Vitals:    05/13/16 0743   BP: 125/84   Pulse: 85   Temp: 97.3 F (36.3 C)   Resp: 16   SpO2: 98%   a+O x 3    Cor rrr  Lungs ctab       A/P Septoplasty - turbinate reduction and drug induced sleep endoscopy.

## 2016-05-13 NOTE — Op Note (Signed)
FULL OPERATIVE NOTE    Date Time: 05/13/2016 10:41 AM  Patient Name: Vincent Miller  Attending Physician: Arnell Asal, MD      Date of Operation:   05/13/2016    Providers Performing:   Surgeon(s):  Arnell Asal, MD    Circulator: Dorene Sorrow, Juliette Alcide, RN  Relief Circulator: Mariann Laster; Greggory Keen, RN  Scrub Person: Dorene Sorrow, [place], RN; Remi Deter, RN  Team Leader: Dizon, Toy Baker, RN    Operative Procedure:   Procedure(s):  ENDOSCOPY NASAL  SEPTOPLASTY SMR, TURBINATES    Preoperative Diagnosis:   Pre-Op Diagnosis Codes:     * Obstructive sleep apnea [G47.33]     * Acquired deflected nasal septum [J34.2]     * Hypertrophy of both inferior nasal turbinates [J34.3]    Postoperative Diagnosis:   Post-Op Diagnosis Codes:     * Obstructive sleep apnea [G47.33]     * Acquired deflected nasal septum [J34.2]     * Hypertrophy of both inferior nasal turbinates [J34.3]    Indications:   Vincent Miller is a 55 y.o. male with obstructive sleep apnea and cpap intolerance. He also has nasal obstruction with septal deviation and inferior turbinate hypertrophy.     Operative Notes:   The patient was taken to the operating room on the stretcher.  General anesthesia was induced with a slow continuous propofol infusion. When the patient was asleep a 3.59mm flexible nasopharyngoscope was introduced into the nares. The velopharynx, base of tongue, epiglottis and glottis were evaluated. There was found to be anterior to posterior collapse.    The patient was awoken and taken to the recovery room     The nasal septum and inferior turbinates were injected with 1%lidocain with epinephrine 1:100,000.  A left hemitransfiction incision was made and a sub perichondrial flap was raised.  The dissection was continued along the posterior septum where a sub periosteal flap was raised.  Next the bony and cartilaginous septum were disarticulated with a Cottle elevator.  The bony septum was divided superiorly and inferiorly  with the Becker scissors and the deviated portion was removed with the [employer].  The maxillary crest was removed with a 4mm osteotome .  The cartilaginous septum was incised leaving a 1.5cm caudal and dorsal strut.   The incision was closed with a 5.0 chromic suture and attention was tuned to the nasal endoscopy and turbinate reduction.  There was moderate hypertrophy of the posterior aspect of the turbinates.  This was reduced with the 2.29mm micro debrider blade. The anterior aspect of the turbinate was incised with the Cottle elevator and a 2.32mm micro debrider blade was used to de bulk the bony and submucosal surface.  The turbinates were out fractured with a freer elevator. The nasal cavity and nasopharynx were inspected with the zero degree endoscope.  No abnormal mucosal masses or lesion were noted.   Breath easy nasal splints were applied and sutured into position with a 3.0 nylon.     The patient was awoken and taken to the recovery room in good condition.          Estimated Blood Loss:   100 mL    Implants:   * No implants in log *    Drains:   Drains: no    Specimens:       Complications:   None     Signed by: Arnell Asal, MD

## 2016-05-16 ENCOUNTER — Encounter: Payer: Self-pay | Admitting: Otolaryngology

## 2017-09-07 ENCOUNTER — Other Ambulatory Visit (INDEPENDENT_AMBULATORY_CARE_PROVIDER_SITE_OTHER): Payer: Self-pay | Admitting: Family Medicine

## 2017-10-14 ENCOUNTER — Emergency Department
Admission: EM | Admit: 2017-10-14 | Discharge: 2017-10-14 | Disposition: A | Discharge status: Home / Self Care | Payer: BC Managed Care – PPO | Attending: Emergency Medicine | Admitting: Emergency Medicine

## 2017-10-14 ENCOUNTER — Emergency Department: Payer: BC Managed Care – PPO

## 2017-10-14 DIAGNOSIS — Z79899 Other long term (current) drug therapy: Secondary | ICD-10-CM | POA: Insufficient documentation

## 2017-10-14 DIAGNOSIS — I252 Old myocardial infarction: Secondary | ICD-10-CM | POA: Insufficient documentation

## 2017-10-14 DIAGNOSIS — E119 Type 2 diabetes mellitus without complications: Secondary | ICD-10-CM | POA: Insufficient documentation

## 2017-10-14 DIAGNOSIS — Z7984 Long term (current) use of oral hypoglycemic drugs: Secondary | ICD-10-CM | POA: Insufficient documentation

## 2017-10-14 DIAGNOSIS — K219 Gastro-esophageal reflux disease without esophagitis: Secondary | ICD-10-CM | POA: Insufficient documentation

## 2017-10-14 DIAGNOSIS — M7732 Calcaneal spur, left foot: Secondary | ICD-10-CM

## 2017-10-14 DIAGNOSIS — I10 Essential (primary) hypertension: Secondary | ICD-10-CM | POA: Insufficient documentation

## 2017-10-14 DIAGNOSIS — E785 Hyperlipidemia, unspecified: Secondary | ICD-10-CM | POA: Insufficient documentation

## 2017-10-14 DIAGNOSIS — Z87891 Personal history of nicotine dependence: Secondary | ICD-10-CM | POA: Insufficient documentation

## 2017-10-14 MED ORDER — OXYCODONE HCL 5 MG PO TABS
5.0000 mg | ORAL_TABLET | Freq: Once | ORAL | Status: AC
Start: 2017-10-14 — End: 2017-10-14
  Administered 2017-10-14: 5 mg via ORAL
  Filled 2017-10-14: qty 1

## 2017-10-14 MED ORDER — ACETAMINOPHEN 500 MG PO TABS
1000.0000 mg | ORAL_TABLET | Freq: Once | ORAL | Status: AC
Start: 2017-10-14 — End: 2017-10-14
  Administered 2017-10-14: 1000 mg via ORAL
  Filled 2017-10-14: qty 2

## 2017-10-14 MED ORDER — OXYCODONE HCL 5 MG PO TABS
5.0000 mg | ORAL_TABLET | ORAL | 0 refills | Status: DC | PRN
Start: 2017-10-14 — End: 2019-06-27

## 2017-10-14 NOTE — ED Triage Notes (Signed)
Patient with difficulty walking x "a couple days". Patient stated bil legs are so painful that he isn't able to walk. Patient stated this problem has been intermittent for the last 6 years. Denies extremity numbness.

## 2017-10-14 NOTE — ED Provider Notes (Signed)
Physician/Midlevel provider first contact with patient: 10/14/17 1158         History     Chief Complaint   Patient presents with   . Leg Pain     Historian: Patient    57 y.o. 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 Pt states that his pain got to the point that he could not walk this morning. Pt states that Ibuprofen has not provided significant relief of his pain. No hx of dx of neuropathy. Pt has had a cortisone shot in his L foot in the past by his podiatrist.  No fevers or chills.     PMD: Lanell Persons, MD         The history is provided by the patient. No language interpreter was used.   Leg Pain      Past Medical History:   Diagnosis Date   . Anemia    . Difficulty walking     d/t ankle pain   . Gastroesophageal reflux disease    . Hiatal hernia    . Hyperlipidemia    . Hypertension    . Myocardial infarction 2009    caused by trauma during cpr   . Paroxysmal nocturnal dyspnea     d/t deflected nasal septum   . Sleep apnea     sleep study done recently, currently not using a cpap   . Type 2 diabetes mellitus, controlled     borderline; does not check glucose @ home       Past Surgical History:   Procedure Laterality Date   . CARDIAC CATHETERIZATION  2008   . ENDOSCOPY NASAL N/A 05/13/2016    Procedure: ENDOSCOPY NASAL;  Surgeon: Arnell Asal, MD;  Location: [facility] OR;  Service: ENT;  Laterality: N/A;  DISE, NASAL ENDOSCOPY, SEPTO, TURBS*        . MOUTH SURGERY     . SEPTOPLASTY SMR, TURBINATES N/A 05/13/2016    Procedure: SEPTOPLASTY SMR, TURBINATES;  Surgeon: Arnell Asal, MD;  Location: [facility] OR;  Service: ENT;  Laterality: N/A;       Family History   Problem Relation Age of Onset   . Adopted: Yes       Social  Social History   Substance Use Topics   . Smoking status: Former Smoker     Packs/day: 0.25     Years: 30.00     Quit date: 11/01/2014    . Smokeless tobacco: Never Used   . Alcohol use 4.2 - 6.0 oz/week     7 - 10 Shots of liquor per week       .     Allergies   Allergen Reactions   . Statins Other (See Comments)     Joint aches   . Penicillins Rash     Turned purple       Home Medications     Med List Status:  In Progress Set By: Angus Palms, RN at 10/14/2017 11:17 AM                atorvastatin (LIPITOR) 10 MG tablet     Take 10 mg by mouth daily.     Cholecalciferol (VITAMIN D) 2000 units Cap     Take 1 tablet by mouth 2 (two) times daily.     clindamycin (CLEOCIN)  300 MG capsule     Take 1 capsule (300 mg total) by mouth 3 (three) times daily.     metFORMIN (GLUCOPHAGE-XR) 500 MG 24 hr tablet     Take 500 mg by mouth 2 (two) times daily.        omeprazole (PRILOSEC) 20 MG capsule     Take 20 mg by mouth daily.     oxyCODONE-acetaminophen (PERCOCET) 5-325 MG per tablet     Take 1 tablet by mouth every 4 (four) hours as needed for Pain.     valsartan-hydroCHLOROthiazide (DIOVAN-HCT) 320-25 MG per tablet     Take 1 tablet by mouth daily.           Review of Systems  Review of Systems   Constitutional: Negative for chills and fever.   HENT: Negative for congestion.    Eyes: Negative for visual disturbance.   Respiratory: Negative for cough.    Cardiovascular: Negative for chest pain.   Gastrointestinal: Negative for abdominal pain, diarrhea and vomiting.   Genitourinary: No dysuria.  Musculoskeletal: +L foot pain (chronic, significantly worsened yesterday).   Neurological: Negative for headaches.   Skin: No rash.  Psychiatric: No SI/HI.  All other systems reviewed and are negative.  Physical Exam    BP: (!) 148/102, Heart Rate: 100, Temp: 98.9 F (37.2 C), Resp Rate: 16, SpO2: 99 %, Weight: 106.6 kg    Physical Exam  Constitutional: No diaphoresis.  HENT:   Head: Normocephalic, atraumatic.   Eyes: PERRLA, EOMI b/l  Neck: Trachea midline  Cardiovascular: RRR    Pulmonary/Chest: Lungs CTA b/l. Normal effort.  Abdominal: Soft, non-tender,  non-distended  Neurological: AAOx4  B/l upper & lower extremities: strength 5/5, sensation intact  Musculoskeletal: Left foot tenderness over the mid foot with edema, no deformity or erythema. No ankle tenderness or deformity. NVI intact.   Skin: No pallor, no erythema  EMR, nursing note, and vitals reviewed.  MDM and ED Course     ED Medication Orders     Start Ordered     Status Ordering Provider    10/14/17 1236 10/14/17 1235  oxyCODONE (ROXICODONE) immediate release tablet 5 mg  Once     Route: Oral  Ordered Dose: 5 mg     Last MAR action:  Given [facility], Wynnie Pacetti    10/14/17 1236 10/14/17 1235  acetaminophen (TYLENOL) tablet 1,000 mg  Once     Route: Oral  Ordered Dose: 1,000 mg     Last MAR action:  Given [place]         Orders Placed This Encounter   Procedures   . Foot Left AP Lateral and Oblique       MDM  Number of Diagnoses or Management Options  Calcaneal spur of foot, left:   Diagnosis management comments: 57 y.o. male with h/o anemia, GERD, hyperlipidemia, MI, Type 2 DM p/w acute on chronic left foot pain.    Differential - Left foot pain with evaluate for occult stress fracture versus arthritis with an x-ray as pt states he is unable to walk.      1337 - X-ray with calcaneal spur, no acute fracture. Provided reassurance & education for supportive care: rest, ice, compression, & elevation.     Will d/c with analgesia & podiatry f/u. Pt declined a hard shoe, as he states he has one at home.    Redge Gainer, MD       Procedures    Clinical Impression & Disposition  Clinical Impression  Final diagnoses:   Calcaneal spur of foot, left        ED Disposition     ED Disposition Condition Date/Time Comment    Discharge  Sat Oct 14, 2017  1:36 PM Vincent Miller discharge to home/self care.    Condition at disposition: Stable           Discharge Medication List as of 10/14/2017  1:37 PM      START taking these medications    Details   oxyCODONE (ROXICODONE) 5 MG immediate release tablet Take 1 tablet  (5 mg total) by mouth every 4 (four) hours as needed for Pain (severe pain).for up to 15 doses, Starting Sat 10/14/2017, Print              Treatment Team: Scribe: Elizabeth Sauer    Attestations:    I am the first provider for this patient and I personally performed the services documented.  Elizabeth Sauer is scribing for me on Miller,Vincent W. I reviewed and confirm the accuracy of the information in this medical record.   Redge Gainer, MD    I, Elizabeth Sauer, am serving as a scribe to document services personally performed by Redge Gainer, MD based on my observations and the providers statements to me.            Redge Gainer, MD  10/14/17 2106

## 2017-10-14 NOTE — Discharge Instructions (Signed)
Take acetaminophen [phone] mg every 6-8 hours as needed for pain.    Take ibuprofen 400-600 mg every 6-8 hours as needed for pain.      Dear Mr. Vincent Miller:    I appreciate your choosing the [facility] for your healthcare needs, and hope your visit today was EXCELLENT.    Instructions:  Please follow-up with a podiatrist such as Dr. Alanda Slim.     Return to the Emergency Department for any worsening symptoms or concerns.    Below is some information that our patients often find helpful.    We wish you good health and please do not hesitate to contact us if we can ever be of any assistance.    Sincerely,  Redge Gainer, MD  [facility]    ________________________________________________________________    If you do not continue to improve or your condition worsens, please contact your doctor or return immediately to the Emergency Department.    Thank you for choosing [facility] for your emergency care needs.  We strive to provide EXCELLENT care to you and your family.      DOCTOR REFERRALS  Call [phone] if you need any further referrals and we can help you find a primary care doctor or specialist.  Also, available online at:  [email or web address]    YOUR CONTACT INFORMATION  Before leaving please check with registration to make sure we have an up-to-date contact number.  You can call registration at [phone] to update your information.  For questions about your hospital bill, please call [phone].  For questions about your Emergency Dept Physician bill please call [phone].      FREE HEALTH SERVICES  If you need help with health or social services, please call 2-1-1 for a free referral to resources in your area.  2-1-1 is a free service connecting people with information on health insurance, free clinics, pregnancy, mental health, dental care, food assistance, housing, and substance abuse counseling.  Also,  available online at:  http://www.[facility].org    MEDICAL RECORDS AND TESTS  Certain laboratory test results do not come back the same day, for example urine cultures.   We will contact you if other important findings are noted.  Radiology films are often reviewed again to ensure accuracy.  If there is any discrepancy, we will notify you.      Please call [phone] to pick up a complimentary CD of any radiology studies performed.  If you or your doctor would like to request a copy of your medical records, please call [phone].      ORTHOPEDIC INJURY   Please know that significant injuries can exist even when an initial x-ray is read as normal or negative.  This can occur because some fractures (broken bones) are not initially visible on x-rays.  For this reason, close outpatient follow-up with your primary care doctor or bone specialist (orthopedist) is required.    MEDICATIONS AND FOLLOWUP  Please be aware that some prescription medications can cause drowsiness.  Use caution when driving or operating machinery.    The examination and treatment you have received in our Emergency Department is provided on an emergency basis, and is not intended to be a substitute for your primary care physician.  It is important that your doctor checks you again and that you report any new or remaining problems at that time.      24 HOUR PHARMACIES  [facility] - [address], [place], [place] [address] (1.4 miles, 7 minutes)  [facility] - [address] [place], [place] [address] (6.5 miles, 13 minutes)  Handout with directions available on request

## 2018-03-15 ENCOUNTER — Other Ambulatory Visit (INDEPENDENT_AMBULATORY_CARE_PROVIDER_SITE_OTHER): Payer: Self-pay | Admitting: Family Medicine

## 2018-04-12 ENCOUNTER — Other Ambulatory Visit (INDEPENDENT_AMBULATORY_CARE_PROVIDER_SITE_OTHER): Payer: Self-pay | Admitting: Family Medicine

## 2018-08-03 ENCOUNTER — Other Ambulatory Visit (INDEPENDENT_AMBULATORY_CARE_PROVIDER_SITE_OTHER): Payer: Self-pay | Admitting: Family Medicine

## 2019-03-20 LAB — COMPREHENSIVE METABOLIC PANEL
ALT: 29 IU/L (ref 0–44)
AST (SGOT): 25 IU/L (ref 0–40)
Albumin/Globulin Ratio: 1.5 (ref 1.2–2.2)
Albumin: 4.4 g/dL (ref 3.5–5.5)
Alkaline Phosphatase: 91 IU/L (ref 39–117)
BUN / Creatinine Ratio: 23 — ABNORMAL HIGH (ref 9–20)
BUN: 22 mg/dL (ref 6–24)
Bilirubin, Total: 0.3 mg/dL (ref 0.0–1.2)
CO2: 23 mmol/L (ref 20–29)
Calcium: 9.6 mg/dL (ref 8.7–10.2)
Chloride: 103 mmol/L (ref 96–106)
Creatinine: 0.96 mg/dL (ref 0.76–1.27)
EGFR: 103 mL/min/{1.73_m2} (ref >59)
EGFR: 89 mL/min/{1.73_m2} (ref >59)
Globulin, Total: 3 g/dL (ref 1.5–4.5)
Glucose: 101 mg/dL — ABNORMAL HIGH (ref 65–99)
Potassium: 4.3 mmol/L (ref 3.5–5.2)
Protein, Total: 7.4 g/dL (ref 6.0–8.5)
Sodium: 145 mmol/L — ABNORMAL HIGH (ref 134–144)

## 2019-03-20 LAB — CBC
Hematocrit: 40.4 % (ref 37.5–51.0)
Hemoglobin: 13.9 g/dL (ref 13.0–17.7)
MCH: 32 pg (ref 26.6–33.0)
MCHC: 34.4 g/dL (ref 31.5–35.7)
MCV: 93 fL (ref 79–97)
Platelets: 207 10*3/uL (ref 150–450)
RBC: 4.35 x10E6/uL (ref 4.14–5.80)
RDW: 13.6 % (ref 12.3–15.4)
WBC: 7.4 10*3/uL (ref 3.4–10.8)

## 2019-03-20 LAB — CMP14+EGFR PANEL 334943
ALT: 28 IU/L (ref 0–44)
AST (SGOT): 16 IU/L (ref 0–40)
Albumin/Globulin Ratio: 1.7 (ref 1.2–2.2)
Albumin: 4.5 g/dL (ref 3.5–5.5)
Alkaline Phosphatase: 88 IU/L (ref 39–117)
BUN / Creatinine Ratio: 22 — ABNORMAL HIGH (ref 9–20)
BUN: 23 mg/dL (ref 6–24)
Bilirubin, Total: 0.5 mg/dL (ref 0.0–1.2)
CO2: 22 mmol/L (ref 20–29)
Calcium: 9.2 mg/dL (ref 8.7–10.2)
Chloride: 104 mmol/L (ref 96–106)
Creatinine: 1.03 mg/dL (ref 0.76–1.27)
EGFR: 82 mL/min/{1.73_m2} (ref >59)
EGFR: 95 mL/min/{1.73_m2} (ref >59)
Globulin, Total: 2.6 g/dL (ref 1.5–4.5)
Glucose: 106 mg/dL — ABNORMAL HIGH (ref 65–99)
Potassium: 4.6 mmol/L (ref 3.5–5.2)
Protein, Total: 7.1 g/dL (ref 6.0–8.5)
Sodium: 142 mmol/L (ref 134–144)

## 2019-03-20 LAB — LIPID PANEL
Cholesterol / HDL Ratio: 4 ratio (ref 0.0–5.0)
Cholesterol / HDL Ratio: 4.7 ratio (ref 0.0–5.0)
Cholesterol: 162 mg/dL (ref 100–199)
Cholesterol: 156 mg/dL (ref 100–199)
HDL: 41 mg/dL (ref >39)
HDL: 33 mg/dL — ABNORMAL LOW (ref >39)
LDL Calculated: 84 mg/dL (ref 0–99)
LDL Calculated: 65 mg/dL (ref 0–99)
Triglycerides: 185 mg/dL — ABNORMAL HIGH (ref 0–149)
Triglycerides: 291 mg/dL — ABNORMAL HIGH (ref 0–149)
VLDL Calculated: 37 mg/dL (ref 5–40)
VLDL Calculated: 58 mg/dL — ABNORMAL HIGH (ref 5–40)

## 2019-03-20 LAB — MICROALBUMIN, RANDOM URINE
Creatinine, UR: 184.2 mg/dL
Microalb/Crt. Ratio: 4.9 mg/g creat (ref 0.0–30.0)
Microalbumin, UR: 9.1 ug/mL

## 2019-03-20 LAB — HEMOGLOBIN A1C
Hemoglobin A1C: 6.2 % — ABNORMAL HIGH (ref 4.8–5.6)
Hemoglobin A1C: 6.1 % — ABNORMAL HIGH (ref 4.8–5.6)

## 2019-03-20 LAB — VITAMIN D,25 OH,TOTAL: Vitamin D 25-Hydroxy: 28 ng/mL — ABNORMAL LOW (ref 30.0–100.0)

## 2019-05-08 LAB — LIPID PANEL
Cholesterol / HDL Ratio: 4.5 ratio (ref 0.0–5.0)
Cholesterol: 145 mg/dL (ref 100–199)
HDL: 32 mg/dL — ABNORMAL LOW (ref >39)
LDL Calculated: 77 mg/dL (ref 0–99)
Triglycerides: 179 mg/dL — ABNORMAL HIGH (ref 0–149)
VLDL Calculated: 36 mg/dL (ref 5–40)

## 2019-05-08 LAB — CMP14+EGFR PANEL 334943
ALT: 25 IU/L (ref 0–44)
AST (SGOT): 20 IU/L (ref 0–40)
Albumin/Globulin Ratio: 1.7 (ref 1.2–2.2)
Albumin: 4.7 g/dL (ref 3.5–5.5)
Alkaline Phosphatase: 88 IU/L (ref 39–117)
BUN / Creatinine Ratio: 16 (ref 9–20)
BUN: 15 mg/dL (ref 6–24)
Bilirubin, Total: 0.3 mg/dL (ref 0.0–1.2)
CO2: 25 mmol/L (ref 20–29)
Calcium: 9.7 mg/dL (ref 8.7–10.2)
Chloride: 101 mmol/L (ref 96–106)
Creatinine: 0.92 mg/dL (ref 0.76–1.27)
EGFR: 108 mL/min/{1.73_m2} (ref >59)
EGFR: 93 mL/min/{1.73_m2} (ref >59)
Globulin, Total: 2.8 g/dL (ref 1.5–4.5)
Glucose: 112 mg/dL — ABNORMAL HIGH (ref 65–99)
Potassium: 4.8 mmol/L (ref 3.5–5.2)
Protein, Total: 7.5 g/dL (ref 6.0–8.5)
Sodium: 142 mmol/L (ref 134–144)

## 2019-05-08 LAB — PSA: Prostate Specific Antigen, Total: 1.5 ng/mL (ref 0.0–4.0)

## 2019-05-08 LAB — HEMOGLOBIN A1C: Hemoglobin A1C: 5.9 % — ABNORMAL HIGH (ref 4.8–5.6)

## 2019-05-08 LAB — MICROALBUMIN, RANDOM URINE
Creatinine, UR: 226.7 mg/dL
Microalb/Crt. Ratio: 6 mg/g creat (ref 0.0–30.0)
Microalbumin, UR: 13.6 ug/mL

## 2019-05-08 LAB — STOOL OCCULT BLOOD, IMMUNOASSAY, (NOT GUAIAC BASED): Occult Blood, Fecal, IA: NEGATIVE

## 2019-06-13 ENCOUNTER — Other Ambulatory Visit (INDEPENDENT_AMBULATORY_CARE_PROVIDER_SITE_OTHER): Payer: Self-pay | Admitting: Family Medicine

## 2019-06-17 ENCOUNTER — Telehealth (INDEPENDENT_AMBULATORY_CARE_PROVIDER_SITE_OTHER): Payer: Self-pay | Admitting: Family Medicine

## 2019-06-17 ENCOUNTER — Encounter (INDEPENDENT_AMBULATORY_CARE_PROVIDER_SITE_OTHER): Payer: Self-pay | Admitting: Family Medicine

## 2019-06-17 DIAGNOSIS — Z0000 Encounter for general adult medical examination without abnormal findings: Secondary | ICD-10-CM

## 2019-06-17 DIAGNOSIS — E119 Type 2 diabetes mellitus without complications: Secondary | ICD-10-CM

## 2019-06-17 DIAGNOSIS — E782 Mixed hyperlipidemia: Secondary | ICD-10-CM

## 2019-06-17 NOTE — Telephone Encounter (Signed)
Orders placed in the [facility] system.  Please have them confirm that they have been received when scheduling.

## 2019-06-17 NOTE — Telephone Encounter (Signed)
Please order for [facility]

## 2019-06-17 NOTE — Telephone Encounter (Signed)
Patient scheduled a med f/u on 9/24 and is requesting to have lab orders before the appt date. Please call patient if labs are ordered so he can schedule.

## 2019-06-18 NOTE — Telephone Encounter (Signed)
mailed

## 2019-06-22 LAB — COMPREHENSIVE METABOLIC PANEL
ALT: 37 IU/L (ref 0–44)
AST (SGOT): 27 IU/L (ref 0–40)
Albumin/Globulin Ratio: 1.6 (ref 1.2–2.2)
Albumin: 4.4 g/dL (ref 3.8–4.9)
Alkaline Phosphatase: 93 IU/L (ref 39–117)
BUN / Creatinine Ratio: 25 — ABNORMAL HIGH (ref 9–20)
BUN: 29 mg/dL — ABNORMAL HIGH (ref 6–24)
Bilirubin, Total: 0.3 mg/dL (ref 0.0–1.2)
CO2: 23 mmol/L (ref 20–29)
Calcium: 9 mg/dL (ref 8.7–10.2)
Chloride: 103 mmol/L (ref 96–106)
Creatinine: 1.14 mg/dL (ref 0.76–1.27)
EGFR: 71 mL/min/{1.73_m2} (ref >59)
EGFR: 83 mL/min/{1.73_m2} (ref >59)
Globulin, Total: 2.8 g/dL (ref 1.5–4.5)
Glucose: 129 mg/dL — ABNORMAL HIGH (ref 65–99)
Potassium: 4.3 mmol/L (ref 3.5–5.2)
Protein, Total: 7.2 g/dL (ref 6.0–8.5)
Sodium: 142 mmol/L (ref 134–144)

## 2019-06-22 LAB — MICROALBUMIN, RANDOM URINE
Creatinine, UR: 191.4 mg/dL
Microalb/Crt. Ratio: 7 mg/g creat (ref 0–29)
Microalbumin, UR: 13.1 ug/mL

## 2019-06-22 LAB — CBC AND DIFFERENTIAL
Baso(Absolute): 0.1 10*3/uL (ref 0.0–0.2)
Basos: 1 %
Eos: 2 %
Eosinophils Absolute: 0.2 10*3/uL (ref 0.0–0.4)
Hematocrit: 36.7 % — ABNORMAL LOW (ref 37.5–51.0)
Hemoglobin: 12.4 g/dL — ABNORMAL LOW (ref 13.0–17.7)
Immature Granulocytes Absolute: 0 10*3/uL (ref 0.0–0.1)
Immature Granulocytes: 0 %
Lymphocytes Absolute: 2.6 10*3/uL (ref 0.7–3.1)
Lymphocytes: 33 %
MCH: 31.9 pg (ref 26.6–33.0)
MCHC: 33.8 g/dL (ref 31.5–35.7)
MCV: 94 fL (ref 79–97)
Monocytes Absolute: 0.5 10*3/uL (ref 0.1–0.9)
Monocytes: 6 %
Neutrophils Absolute: 4.6 10*3/uL (ref 1.4–7.0)
Neutrophils: 58 %
Platelets: 258 10*3/uL (ref 150–450)
RBC: 3.89 x10E6/uL — ABNORMAL LOW (ref 4.14–5.80)
RDW: 13.3 % (ref 11.6–15.4)
WBC: 7.9 10*3/uL (ref 3.4–10.8)

## 2019-06-22 LAB — HEMOGLOBIN A1C: Hemoglobin A1C: 6.1 % — ABNORMAL HIGH (ref 4.8–5.6)

## 2019-06-22 LAB — LIPID PANEL
Cholesterol / HDL Ratio: 5.2 ratio — ABNORMAL HIGH (ref 0.0–5.0)
Cholesterol: 166 mg/dL (ref 100–199)
HDL: 32 mg/dL — ABNORMAL LOW (ref >39)
LDL Chol Calculated (NIH): 48 mg/dL (ref 0–99)
Triglycerides: 592 mg/dL (ref 0–149)
VLDL Calculated: 86 mg/dL — ABNORMAL HIGH (ref 5–40)

## 2019-06-23 ENCOUNTER — Other Ambulatory Visit (INDEPENDENT_AMBULATORY_CARE_PROVIDER_SITE_OTHER): Payer: Self-pay | Admitting: Family Medicine

## 2019-06-24 ENCOUNTER — Other Ambulatory Visit (INDEPENDENT_AMBULATORY_CARE_PROVIDER_SITE_OTHER): Payer: Self-pay

## 2019-06-24 DIAGNOSIS — K219 Gastro-esophageal reflux disease without esophagitis: Secondary | ICD-10-CM

## 2019-06-24 MED ORDER — OMEPRAZOLE 20 MG PO CPDR
20.0000 mg | DELAYED_RELEASE_CAPSULE | Freq: Every day | ORAL | 1 refills | Status: DC
Start: 2019-06-24 — End: 2019-06-27

## 2019-06-24 NOTE — Telephone Encounter (Signed)
Please authorize pending prescription if appropriate. Patient was last seen on January 23,2020.

## 2019-06-27 ENCOUNTER — Encounter (INDEPENDENT_AMBULATORY_CARE_PROVIDER_SITE_OTHER): Payer: Self-pay | Admitting: Family Medicine

## 2019-06-27 ENCOUNTER — Telehealth (INDEPENDENT_AMBULATORY_CARE_PROVIDER_SITE_OTHER): Payer: No Typology Code available for payment source | Admitting: Family Medicine

## 2019-06-27 VITALS — Temp 97.0°F | Ht 72.0 in | Wt 235.0 lb

## 2019-06-27 DIAGNOSIS — K219 Gastro-esophageal reflux disease without esophagitis: Secondary | ICD-10-CM

## 2019-06-27 DIAGNOSIS — I119 Hypertensive heart disease without heart failure: Secondary | ICD-10-CM

## 2019-06-27 DIAGNOSIS — E119 Type 2 diabetes mellitus without complications: Secondary | ICD-10-CM

## 2019-06-27 DIAGNOSIS — J40 Bronchitis, not specified as acute or chronic: Secondary | ICD-10-CM

## 2019-06-27 DIAGNOSIS — I2510 Atherosclerotic heart disease of native coronary artery without angina pectoris: Secondary | ICD-10-CM

## 2019-06-27 DIAGNOSIS — N529 Male erectile dysfunction, unspecified: Secondary | ICD-10-CM

## 2019-06-27 MED ORDER — METFORMIN HCL ER 500 MG PO TB24
500.00 mg | ORAL_TABLET | Freq: Two times a day (BID) | ORAL | 1 refills | Status: DC
Start: 2019-06-27 — End: 2019-11-08

## 2019-06-27 MED ORDER — OMEPRAZOLE 20 MG PO CPDR
20.0000 mg | DELAYED_RELEASE_CAPSULE | Freq: Every day | ORAL | 1 refills | Status: DC
Start: 2019-06-27 — End: 2019-11-08

## 2019-06-27 MED ORDER — ATORVASTATIN CALCIUM 20 MG PO TABS
20.0000 mg | ORAL_TABLET | Freq: Every day | ORAL | 1 refills | Status: DC
Start: 2019-06-27 — End: 2019-11-08

## 2019-06-27 MED ORDER — METOPROLOL SUCCINATE ER 50 MG PO TB24
50.0000 mg | ORAL_TABLET | ORAL | 1 refills | Status: DC
Start: 2019-06-27 — End: 2019-11-08

## 2019-06-27 MED ORDER — ALBUTEROL SULFATE (2.5 MG/3ML) 0.083% IN NEBU
2.5000 mg | INHALATION_SOLUTION | RESPIRATORY_TRACT | 1 refills | Status: AC | PRN
Start: 2019-06-27 — End: ?

## 2019-06-27 MED ORDER — TADALAFIL 20 MG PO TABS
ORAL_TABLET | ORAL | 11 refills | Status: AC
Start: 2019-06-27 — End: ?

## 2019-06-27 NOTE — Progress Notes (Signed)
[facility] [place] [facility]                       Date of Virtual Visit: 06/27/2019 5:46 PM        Patient ID: Vincent Miller is a 57 y.o. male.  Attending Physician: Delfin Gant, MD       Telemedicine Eligibility:    State Location:  [x]  [place]  []  [place]  []  [place] []  [place]  []  Other:    Physical Location:  [x]  Home  []         []        []          []  Other:    Patient Identity Verification:  [x]  State Issued ID  []  Insurance Eligibility Check  []  Other:    Physical Address Verification: (for 911)  [x]  Yes  []  No    Personal identity shared with patient:  [x]  Yes  []  No    Education on nature of video visit shared with patient:  [x]  Yes  []  No    Emergency plan agreed upon with patient:  [x]  Yes  []  No    If the patient had not had this virtual visit, what would they have done?  []         []         []        []          []  Other:    Visit terminated since not appropriate for virtual care:  [x]  N/A  []  Reason:         Chief Complaint:    Chief Complaint   Patient presents with   . Hypertension               HPI:    Patient presents for follow-up today.  Type 2 diabetes is under good control with A1c of 6.1.  No paresthesias of the feet, but he complains of persistent foot pain, primarily when he first gets on his feet.  Seems to improve over time.  He had plantar fasciitis in the past, but this feels different.    Hypertension, stable on medication.  Coronary artery disease, stable on medication.  No chest pains or shortness of breath.    Hyperlipidemia, stable on medication.    GERD, also stable on medication.    Recurrent bronchitis, would like to have nebulizer solution in case he gets sick over the winter.    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  This works well.    Hypertension  The patient is being seen for a routine follow-up of hypertension. Hypertension is classified as essential. Comorbid illnesses include  hyperlipidemia and CAD. Current therapy includes ARBs and diuretics. Patient is compliant with the current regimen.   Average ambulatory systolic blood pressure is generally in the 130's. Average ambulatory diastolic blood pressure is generally in the 90's. Lifestyle changes have included improved nutrition. The patient reports exercising 5-7 days/week. The patient's exercise routine consists of walking.             Problem List:    Patient Active Problem List   Diagnosis   . Acute myocardial infarction   . Allergic rhinitis   . Coronary atherosclerosis   . ED (erectile dysfunction) of organic origin   . Gastroesophageal reflux disease   . Hypertensive heart disease   . Mixed hyperlipidemia   . Type 2 diabetes  mellitus without complication   . Vitamin D deficiency             Current Meds:    No outpatient medications have been marked as taking for the 06/27/19 encounter (Telemedicine Visit) with Delfin Gant, MD.          Allergies:    Allergies   Allergen Reactions   . Statins Other (See Comments)     Joint aches   . Penicillins Rash     Turned purple             Past Surgical History:    Past Surgical History:   Procedure Laterality Date   . CARDIAC CATHETERIZATION  2008   . ENDOSCOPY NASAL N/A 05/13/2016    Procedure: ENDOSCOPY NASAL;  Surgeon: Arnell Asal, MD;  Location: [facility] OR;  Service: ENT;  Laterality: N/A;  DISE, NASAL ENDOSCOPY, SEPTO, TURBS*        . MOUTH SURGERY     . SEPTOPLASTY SMR, TURBINATES N/A 05/13/2016    Procedure: SEPTOPLASTY SMR, TURBINATES;  Surgeon: Arnell Asal, MD;  Location: [facility] OR;  Service: ENT;  Laterality: N/A;           Family History:    Family History   Adopted: Yes           Social History:    Social History     Tobacco Use   . Smoking status: Former Smoker     Packs/day: 0.25     Years: 30.00     Pack years: 7.50     Quit date: 11/01/2014     Years since quitting: 4.6   . Smokeless tobacco: Never Used   Substance Use Topics   . Alcohol use: Yes      Alcohol/week: 7.0 - 10.0 standard drinks     Types: 7 - 10 Shots of liquor per week   . Drug use: No          The following sections were reviewed this encounter by the provider:   Tobacco  Allergies  Meds  Problems  Med Hx  Surg Hx  Fam Hx             Vital Signs:    Temp 97 F (36.1 C)   Ht 1.829 m (6')   Wt 106.6 kg (235 lb)   BMI 31.87 kg/m          ROS:    Review of Systems           Physical Exam:    Physical Exam   GENERAL APPEARANCE: alert, in no acute distress, pleasant, well nourished.   HEAD: normal appearance  EYES: no discharge  EARS: normal hearing  NECK/THYROID: appearance -supple  CHEST: no evidence of respiratory distress. Breathing comfortably  PSYCH: appropriate affect, appropriate mood, normal speech, normal attention        Assessment:    1. Gastroesophageal reflux disease, esophagitis presence not specified  - omeprazole (PriLOSEC) 20 MG capsule; Take 1 capsule (20 mg total) by mouth daily  Dispense: 90 capsule; Refill: 1    2. Bronchitis  - albuterol (PROVENTIL) (2.5 MG/3ML) 0.083% nebulizer solution; Take 3 mLs (2.5 mg total) by nebulization every 4 (four) hours as needed for Wheezing  Dispense: 25 each; Refill: 1    3. Type 2 diabetes mellitus without complication, without long-term current use of insulin  - metFORMIN (GLUCOPHAGE-XR) 500 MG 24 hr tablet; Take 1 tablet (500 mg total) by  mouth 2 (two) times daily  Dispense: 180 tablet; Refill: 1    4. Atherosclerosis of native coronary artery of native heart without angina pectoris  - atorvastatin (LIPITOR) 20 MG tablet; Take 1 tablet (20 mg total) by mouth daily  Dispense: 90 tablet; Refill: 1    5. Hypertensive heart disease without heart failure  - metoprolol succinate XL (TOPROL-XL) 50 MG 24 hr tablet; Take 1 tablet (50 mg total) by mouth every 24 hours  Dispense: 90 tablet; Refill: 1    6. ED (erectile dysfunction) of organic origin  - tadalafil (CIALIS) 20 MG tablet; TAKE ONE TABLET BY MOUTH EVERY DAY 1 HOUR BEFORE NEEDED   Dispense: 10 tablet; Refill: 11            Plan:    Continue medications.  Diet and exercise.  Follow-up 6 months, sooner as needed.            Follow-up:    Return in about 6 months (around 12/25/2019) for Diabetes f/u.         Delfin Gant, MD

## 2019-07-03 ENCOUNTER — Ambulatory Visit (INDEPENDENT_AMBULATORY_CARE_PROVIDER_SITE_OTHER): Payer: Self-pay | Admitting: Cardiovascular Disease

## 2019-07-05 ENCOUNTER — Telehealth (INDEPENDENT_AMBULATORY_CARE_PROVIDER_SITE_OTHER): Payer: Self-pay | Admitting: Family Medicine

## 2019-07-05 DIAGNOSIS — Z1211 Encounter for screening for malignant neoplasm of colon: Secondary | ICD-10-CM

## 2019-07-05 NOTE — Telephone Encounter (Signed)
Please mail to pt

## 2019-07-05 NOTE — Telephone Encounter (Signed)
Pt left a vm stating Dr Excell Seltzer recommended he needed to have a colonoscopy. Pt says he needs a referral order before they will schedule. pls advise

## 2019-07-09 NOTE — Telephone Encounter (Signed)
Will place orders   

## 2019-07-09 NOTE — Telephone Encounter (Signed)
Please order colonoscopy

## 2019-07-09 NOTE — Telephone Encounter (Signed)
Order needs to be mailed to patient please

## 2019-07-09 NOTE — Addendum Note (Signed)
Addended by: Lanell Persons A. on: 07/09/2019 05:59 PM     Modules accepted: Orders

## 2019-07-10 ENCOUNTER — Encounter (INDEPENDENT_AMBULATORY_CARE_PROVIDER_SITE_OTHER): Payer: Self-pay | Admitting: Family Medicine

## 2019-07-10 NOTE — Telephone Encounter (Signed)
Lm- mailed order

## 2019-07-31 ENCOUNTER — Encounter (INDEPENDENT_AMBULATORY_CARE_PROVIDER_SITE_OTHER): Payer: Self-pay | Admitting: Family Medicine

## 2019-07-31 ENCOUNTER — Telehealth (INDEPENDENT_AMBULATORY_CARE_PROVIDER_SITE_OTHER): Payer: Self-pay | Admitting: Family Medicine

## 2019-07-31 ENCOUNTER — Encounter (INDEPENDENT_AMBULATORY_CARE_PROVIDER_SITE_OTHER): Payer: Self-pay

## 2019-07-31 DIAGNOSIS — E782 Mixed hyperlipidemia: Secondary | ICD-10-CM

## 2019-07-31 DIAGNOSIS — D649 Anemia, unspecified: Secondary | ICD-10-CM

## 2019-07-31 NOTE — Telephone Encounter (Signed)
Pt left vm to get BW orders to cholesterol to check triglycerides.pls advise and call.

## 2019-07-31 NOTE — Telephone Encounter (Signed)
Please order

## 2019-07-31 NOTE — Telephone Encounter (Signed)
Ordered, but just had one month ago.  Rec waiting for 3 months from last test.

## 2019-08-02 LAB — LIPID PANEL
Cholesterol / HDL Ratio: 6.9 ratio — ABNORMAL HIGH (ref 0.0–5.0)
Cholesterol: 173 mg/dL (ref 100–199)
HDL: 25 mg/dL — ABNORMAL LOW (ref >39)
LDL Chol Calculated (NIH): 82 mg/dL (ref 0–99)
Triglycerides: 408 mg/dL — ABNORMAL HIGH (ref 0–149)
VLDL Calculated: 66 mg/dL — ABNORMAL HIGH (ref 5–40)

## 2019-08-02 LAB — CBC AND DIFFERENTIAL
Baso(Absolute): 0.1 10*3/uL (ref 0.0–0.2)
Basos: 1 %
Eos: 3 %
Eosinophils Absolute: 0.3 10*3/uL (ref 0.0–0.4)
Hematocrit: 40.4 % (ref 37.5–51.0)
Hemoglobin: 13.3 g/dL (ref 13.0–17.7)
Immature Granulocytes Absolute: 0 10*3/uL (ref 0.0–0.1)
Immature Granulocytes: 0 %
Lymphocytes Absolute: 2 10*3/uL (ref 0.7–3.1)
Lymphocytes: 23 %
MCH: 31 pg (ref 26.6–33.0)
MCHC: 32.9 g/dL (ref 31.5–35.7)
MCV: 94 fL (ref 79–97)
Monocytes Absolute: 0.6 10*3/uL (ref 0.1–0.9)
Monocytes: 7 %
Neutrophils Absolute: 5.8 10*3/uL (ref 1.4–7.0)
Neutrophils: 66 %
Platelets: 228 10*3/uL (ref 150–450)
RBC: 4.29 x10E6/uL (ref 4.14–5.80)
RDW: 12.9 % (ref 11.6–15.4)
WBC: 8.8 10*3/uL (ref 3.4–10.8)

## 2019-08-02 LAB — IRON PROFILE
Iron Saturation: 11 % — ABNORMAL LOW (ref 15–55)
Iron: 37 ug/dL — ABNORMAL LOW (ref 38–169)
TIBC: 332 ug/dL (ref 250–450)
UIBC: 295 ug/dL (ref 111–343)

## 2019-08-02 LAB — FERRITIN: Ferritin: 422 ng/mL — ABNORMAL HIGH (ref 30–400)

## 2019-08-16 ENCOUNTER — Ambulatory Visit (INDEPENDENT_AMBULATORY_CARE_PROVIDER_SITE_OTHER): Payer: Self-pay

## 2019-09-05 ENCOUNTER — Encounter (INDEPENDENT_AMBULATORY_CARE_PROVIDER_SITE_OTHER): Payer: Self-pay | Admitting: Family Medicine

## 2019-09-05 HISTORY — PX: COLONOSCOPY: SHX174

## 2019-09-10 ENCOUNTER — Encounter (INDEPENDENT_AMBULATORY_CARE_PROVIDER_SITE_OTHER): Payer: Self-pay | Admitting: Family Medicine

## 2019-09-16 ENCOUNTER — Encounter (INDEPENDENT_AMBULATORY_CARE_PROVIDER_SITE_OTHER): Payer: Self-pay | Admitting: Family Medicine

## 2019-10-01 ENCOUNTER — Encounter (INDEPENDENT_AMBULATORY_CARE_PROVIDER_SITE_OTHER): Payer: Self-pay | Admitting: Family Medicine

## 2019-10-09 ENCOUNTER — Other Ambulatory Visit (INDEPENDENT_AMBULATORY_CARE_PROVIDER_SITE_OTHER): Payer: Self-pay | Admitting: Family Medicine

## 2019-10-09 DIAGNOSIS — I119 Hypertensive heart disease without heart failure: Secondary | ICD-10-CM

## 2019-10-10 NOTE — Telephone Encounter (Signed)
90 day supply 3 refill pended for valsartan-hydroCHLOROthiazide (DIOVAN-HCT) 320-25 MG per tablet    VV 06/27/2019    Please assess    Thanks

## 2019-10-21 ENCOUNTER — Encounter (INDEPENDENT_AMBULATORY_CARE_PROVIDER_SITE_OTHER): Payer: Self-pay | Admitting: Family Medicine

## 2019-10-21 DIAGNOSIS — K449 Diaphragmatic hernia without obstruction or gangrene: Secondary | ICD-10-CM | POA: Insufficient documentation

## 2019-11-07 ENCOUNTER — Other Ambulatory Visit (INDEPENDENT_AMBULATORY_CARE_PROVIDER_SITE_OTHER): Payer: Self-pay | Admitting: Family Medicine

## 2019-11-07 DIAGNOSIS — E119 Type 2 diabetes mellitus without complications: Secondary | ICD-10-CM

## 2019-11-07 DIAGNOSIS — I2510 Atherosclerotic heart disease of native coronary artery without angina pectoris: Secondary | ICD-10-CM

## 2019-11-07 DIAGNOSIS — I119 Hypertensive heart disease without heart failure: Secondary | ICD-10-CM

## 2019-11-07 DIAGNOSIS — K219 Gastro-esophageal reflux disease without esophagitis: Secondary | ICD-10-CM

## 2019-12-04 ENCOUNTER — Other Ambulatory Visit (INDEPENDENT_AMBULATORY_CARE_PROVIDER_SITE_OTHER): Payer: Self-pay | Admitting: Family Medicine

## 2019-12-04 DIAGNOSIS — I119 Hypertensive heart disease without heart failure: Secondary | ICD-10-CM

## 2019-12-05 NOTE — Telephone Encounter (Signed)
Please approve pending script. Pt was last eval on 06/27/2019.

## 2019-12-05 NOTE — Telephone Encounter (Signed)
Please schedule f/u VV or OV.  For end of March

## 2019-12-13 ENCOUNTER — Encounter (INDEPENDENT_AMBULATORY_CARE_PROVIDER_SITE_OTHER): Payer: Self-pay

## 2019-12-17 ENCOUNTER — Encounter (INDEPENDENT_AMBULATORY_CARE_PROVIDER_SITE_OTHER): Payer: Self-pay

## 2020-01-09 ENCOUNTER — Encounter (INDEPENDENT_AMBULATORY_CARE_PROVIDER_SITE_OTHER): Payer: Self-pay

## 2020-01-27 ENCOUNTER — Other Ambulatory Visit (INDEPENDENT_AMBULATORY_CARE_PROVIDER_SITE_OTHER): Payer: Self-pay | Admitting: Family Medicine

## 2020-01-27 DIAGNOSIS — I119 Hypertensive heart disease without heart failure: Secondary | ICD-10-CM

## 2020-01-27 DIAGNOSIS — I2510 Atherosclerotic heart disease of native coronary artery without angina pectoris: Secondary | ICD-10-CM

## 2020-01-28 ENCOUNTER — Other Ambulatory Visit (INDEPENDENT_AMBULATORY_CARE_PROVIDER_SITE_OTHER): Payer: Self-pay | Admitting: Family Medicine

## 2020-01-28 DIAGNOSIS — K219 Gastro-esophageal reflux disease without esophagitis: Secondary | ICD-10-CM

## 2020-01-28 DIAGNOSIS — E119 Type 2 diabetes mellitus without complications: Secondary | ICD-10-CM

## 2020-01-28 NOTE — Telephone Encounter (Signed)
90 day supply 0 refill pended for metFORMIN (GLUCOPHAGE-XR) 500 MG 24 hr tablet  AND  omeprazole (PriLOSEC) 20 MG capsule    VV 06/27/2019 to f/u 6 months, noted on Rx    Please assess    Thanks

## 2020-02-08 ENCOUNTER — Encounter (INDEPENDENT_AMBULATORY_CARE_PROVIDER_SITE_OTHER): Payer: Self-pay

## 2020-02-09 ENCOUNTER — Encounter (INDEPENDENT_AMBULATORY_CARE_PROVIDER_SITE_OTHER): Payer: Self-pay

## 2020-02-23 ENCOUNTER — Other Ambulatory Visit (INDEPENDENT_AMBULATORY_CARE_PROVIDER_SITE_OTHER): Payer: Self-pay | Admitting: Family Medicine

## 2020-02-23 DIAGNOSIS — I119 Hypertensive heart disease without heart failure: Secondary | ICD-10-CM

## 2020-02-24 NOTE — Telephone Encounter (Signed)
Pt evaluated for hypertension on 06/27/2019.      Advised at that time to f/u in 6 months     90-day pended with no refills as refill request is coming from mail order pharmacy.    Please authorize if appropriate.    Clerical staff:  Please contact pt to schedule a f/u chronic care OV.  Thank you

## 2020-03-10 ENCOUNTER — Encounter (INDEPENDENT_AMBULATORY_CARE_PROVIDER_SITE_OTHER): Payer: Self-pay

## 2020-03-11 ENCOUNTER — Encounter (INDEPENDENT_AMBULATORY_CARE_PROVIDER_SITE_OTHER): Payer: Self-pay

## 2020-04-09 ENCOUNTER — Encounter (INDEPENDENT_AMBULATORY_CARE_PROVIDER_SITE_OTHER): Payer: Self-pay

## 2020-04-10 ENCOUNTER — Encounter (INDEPENDENT_AMBULATORY_CARE_PROVIDER_SITE_OTHER): Payer: Self-pay

## 2020-04-17 ENCOUNTER — Other Ambulatory Visit (INDEPENDENT_AMBULATORY_CARE_PROVIDER_SITE_OTHER): Payer: Self-pay | Admitting: Family Medicine

## 2020-04-17 DIAGNOSIS — K219 Gastro-esophageal reflux disease without esophagitis: Secondary | ICD-10-CM

## 2020-04-17 DIAGNOSIS — E119 Type 2 diabetes mellitus without complications: Secondary | ICD-10-CM

## 2020-04-17 DIAGNOSIS — I119 Hypertensive heart disease without heart failure: Secondary | ICD-10-CM

## 2020-04-17 DIAGNOSIS — I2510 Atherosclerotic heart disease of native coronary artery without angina pectoris: Secondary | ICD-10-CM

## 2020-04-19 NOTE — Telephone Encounter (Signed)
Needs OV.  

## 2020-05-10 ENCOUNTER — Encounter (INDEPENDENT_AMBULATORY_CARE_PROVIDER_SITE_OTHER): Payer: Self-pay

## 2020-05-11 ENCOUNTER — Encounter (INDEPENDENT_AMBULATORY_CARE_PROVIDER_SITE_OTHER): Payer: Self-pay

## 2020-05-19 ENCOUNTER — Other Ambulatory Visit (INDEPENDENT_AMBULATORY_CARE_PROVIDER_SITE_OTHER): Payer: Self-pay | Admitting: Family Medicine

## 2020-05-19 DIAGNOSIS — I119 Hypertensive heart disease without heart failure: Secondary | ICD-10-CM

## 2020-05-19 NOTE — Telephone Encounter (Signed)
Needs OV.  

## 2020-06-10 ENCOUNTER — Encounter (INDEPENDENT_AMBULATORY_CARE_PROVIDER_SITE_OTHER): Payer: Self-pay

## 2020-06-11 ENCOUNTER — Encounter (INDEPENDENT_AMBULATORY_CARE_PROVIDER_SITE_OTHER): Payer: Self-pay

## 2020-07-10 ENCOUNTER — Encounter (INDEPENDENT_AMBULATORY_CARE_PROVIDER_SITE_OTHER): Payer: Self-pay

## 2020-07-11 ENCOUNTER — Encounter (INDEPENDENT_AMBULATORY_CARE_PROVIDER_SITE_OTHER): Payer: Self-pay

## 2020-07-11 ENCOUNTER — Other Ambulatory Visit (INDEPENDENT_AMBULATORY_CARE_PROVIDER_SITE_OTHER): Payer: Self-pay | Admitting: Family Medicine

## 2020-07-11 DIAGNOSIS — E119 Type 2 diabetes mellitus without complications: Secondary | ICD-10-CM

## 2020-07-11 DIAGNOSIS — I119 Hypertensive heart disease without heart failure: Secondary | ICD-10-CM

## 2020-07-11 DIAGNOSIS — I2510 Atherosclerotic heart disease of native coronary artery without angina pectoris: Secondary | ICD-10-CM

## 2020-07-11 DIAGNOSIS — K219 Gastro-esophageal reflux disease without esophagitis: Secondary | ICD-10-CM

## 2020-07-12 NOTE — Telephone Encounter (Signed)
Has not been seen in over a year.  Needs a new doctor if living in [place].  Rx's denied. Can give short-term 30 days until has appt with someone here if still in town or has a new doctor.  This is the last rx

## 2020-07-13 NOTE — Telephone Encounter (Addendum)
Patient wants to see Dr Excell Seltzer fora WME,schedulled for a WME on 11/19 at 7:15

## 2020-07-27 ENCOUNTER — Other Ambulatory Visit (INDEPENDENT_AMBULATORY_CARE_PROVIDER_SITE_OTHER): Payer: Self-pay | Admitting: Family Medicine

## 2020-07-27 DIAGNOSIS — K219 Gastro-esophageal reflux disease without esophagitis: Secondary | ICD-10-CM

## 2020-07-27 DIAGNOSIS — I2510 Atherosclerotic heart disease of native coronary artery without angina pectoris: Secondary | ICD-10-CM

## 2020-07-27 DIAGNOSIS — E119 Type 2 diabetes mellitus without complications: Secondary | ICD-10-CM

## 2020-07-27 DIAGNOSIS — I119 Hypertensive heart disease without heart failure: Secondary | ICD-10-CM

## 2020-08-01 ENCOUNTER — Other Ambulatory Visit (INDEPENDENT_AMBULATORY_CARE_PROVIDER_SITE_OTHER): Payer: Self-pay | Admitting: Family Medicine

## 2020-08-01 DIAGNOSIS — I2510 Atherosclerotic heart disease of native coronary artery without angina pectoris: Secondary | ICD-10-CM

## 2020-08-04 NOTE — Telephone Encounter (Signed)
30 tablets / 0 refills   aspirin EC 81 MG EC tablet    Last Telemed: 06/27/2019 (dx: type 2 dm + 5 more)    Dr Excell Seltzer - please assess. Per your note on 07/11/2020 - patient needs a new doctor in [facility].     Thank you!

## 2020-08-10 ENCOUNTER — Telehealth (INDEPENDENT_AMBULATORY_CARE_PROVIDER_SITE_OTHER): Payer: Self-pay | Admitting: Family Medicine

## 2020-08-10 ENCOUNTER — Encounter (INDEPENDENT_AMBULATORY_CARE_PROVIDER_SITE_OTHER): Payer: Self-pay

## 2020-08-10 ENCOUNTER — Other Ambulatory Visit (INDEPENDENT_AMBULATORY_CARE_PROVIDER_SITE_OTHER): Payer: Self-pay | Admitting: Family Medicine

## 2020-08-10 DIAGNOSIS — I119 Hypertensive heart disease without heart failure: Secondary | ICD-10-CM

## 2020-08-10 DIAGNOSIS — Z0000 Encounter for general adult medical examination without abnormal findings: Secondary | ICD-10-CM

## 2020-08-10 DIAGNOSIS — E119 Type 2 diabetes mellitus without complications: Secondary | ICD-10-CM

## 2020-08-10 NOTE — Telephone Encounter (Signed)
Patient walked in asking for a BW order

## 2020-08-11 ENCOUNTER — Encounter (INDEPENDENT_AMBULATORY_CARE_PROVIDER_SITE_OTHER): Payer: Self-pay

## 2020-08-11 ENCOUNTER — Other Ambulatory Visit (INDEPENDENT_AMBULATORY_CARE_PROVIDER_SITE_OTHER): Payer: Self-pay | Admitting: Family Medicine

## 2020-08-11 ENCOUNTER — Encounter (INDEPENDENT_AMBULATORY_CARE_PROVIDER_SITE_OTHER): Payer: Self-pay | Admitting: Family Medicine

## 2020-08-11 DIAGNOSIS — I119 Hypertensive heart disease without heart failure: Secondary | ICD-10-CM

## 2020-08-11 LAB — CBC AND DIFFERENTIAL
Baso(Absolute): 0.1 10*3/uL (ref 0.0–0.2)
Basos: 1 %
Eos: 5 %
Eosinophils Absolute: 0.4 10*3/uL (ref 0.0–0.4)
Hematocrit: 40.3 % (ref 37.5–51.0)
Hemoglobin: 13.8 g/dL (ref 13.0–17.7)
Immature Granulocytes Absolute: 0 10*3/uL (ref 0.0–0.1)
Immature Granulocytes: 0 %
Lymphocytes Absolute: 1.9 10*3/uL (ref 0.7–3.1)
Lymphocytes: 25 %
MCH: 31.7 pg (ref 26.6–33.0)
MCHC: 34.2 g/dL (ref 31.5–35.7)
MCV: 92 fL (ref 79–97)
Monocytes Absolute: 0.5 10*3/uL (ref 0.1–0.9)
Monocytes: 7 %
Neutrophils Absolute: 4.5 10*3/uL (ref 1.4–7.0)
Neutrophils: 62 %
Platelets: 244 10*3/uL (ref 150–450)
RBC: 4.36 x10E6/uL (ref 4.14–5.80)
RDW: 13 % (ref 11.6–15.4)
WBC: 7.3 10*3/uL (ref 3.4–10.8)

## 2020-08-11 LAB — COMPREHENSIVE METABOLIC PANEL
ALT: 34 IU/L (ref 0–44)
AST (SGOT): 28 IU/L (ref 0–40)
African American eGFR: 87 mL/min/{1.73_m2} (ref >59)
Albumin/Globulin Ratio: 1.5 (ref 1.2–2.2)
Albumin: 4.5 g/dL (ref 3.8–4.9)
Alkaline Phosphatase: 102 IU/L (ref 44–121)
BUN / Creatinine Ratio: 22 — ABNORMAL HIGH (ref 9–20)
BUN: 24 mg/dL (ref 6–24)
Bilirubin, Total: 0.3 mg/dL (ref 0.0–1.2)
CO2: 24 mmol/L (ref 20–29)
Calcium: 9.3 mg/dL (ref 8.7–10.2)
Chloride: 103 mmol/L (ref 96–106)
Creatinine: 1.09 mg/dL (ref 0.76–1.27)
Globulin, Total: 3 g/dL (ref 1.5–4.5)
Glucose: 135 mg/dL — ABNORMAL HIGH (ref 65–99)
Potassium: 3.9 mmol/L (ref 3.5–5.2)
Protein, Total: 7.5 g/dL (ref 6.0–8.5)
Sodium: 143 mmol/L (ref 134–144)
non-African American eGFR: 75 mL/min/{1.73_m2} (ref >59)

## 2020-08-11 LAB — MICROALBUMIN, RANDOM URINE
Creatinine, UR: 230.9 mg/dL
Microalb/Crt. Ratio: 39 mg/g creat — ABNORMAL HIGH (ref 0–29)
Microalbumin, UR: 89.3 ug/mL

## 2020-08-11 LAB — LIPID PANEL
Cholesterol / HDL Ratio: 4.6 ratio (ref 0.0–5.0)
Cholesterol: 182 mg/dL (ref 100–199)
HDL: 40 mg/dL (ref >39)
LDL Chol Calculated (NIH): 100 mg/dL — ABNORMAL HIGH (ref 0–99)
Triglycerides: 249 mg/dL — ABNORMAL HIGH (ref 0–149)
VLDL Calculated: 42 mg/dL — ABNORMAL HIGH (ref 5–40)

## 2020-08-11 LAB — PSA TOTAL (REFLEX TO FREE): Prostate Specific Antigen, Total: 2 ng/mL (ref 0.0–4.0)

## 2020-08-11 LAB — HEMOGLOBIN A1C: Hemoglobin A1C: 5.9 % — ABNORMAL HIGH (ref 4.8–5.6)

## 2020-08-12 NOTE — Telephone Encounter (Signed)
Med f/u done on 06/27/2019.    Pt due for a physical evaluation at this time.     Clerical dept:  Please contact pt to schedule an Wellness Visit.  Thank you    30-day supply pended with note to pharmacy - pt should f/u with PCP prior to any additional refills.    Please review and authorize if appropriate.  Thank you

## 2020-08-12 NOTE — Telephone Encounter (Signed)
Pt scheduled  

## 2020-08-21 ENCOUNTER — Ambulatory Visit (INDEPENDENT_AMBULATORY_CARE_PROVIDER_SITE_OTHER): Payer: No Typology Code available for payment source | Admitting: Family Medicine

## 2020-08-21 ENCOUNTER — Encounter (INDEPENDENT_AMBULATORY_CARE_PROVIDER_SITE_OTHER): Payer: Self-pay | Admitting: Family Medicine

## 2020-08-21 VITALS — BP 150/88 | HR 94 | Temp 97.3°F | Ht 72.0 in | Wt 233.0 lb

## 2020-08-21 DIAGNOSIS — E782 Mixed hyperlipidemia: Secondary | ICD-10-CM

## 2020-08-21 DIAGNOSIS — Z0000 Encounter for general adult medical examination without abnormal findings: Secondary | ICD-10-CM

## 2020-08-21 DIAGNOSIS — R809 Proteinuria, unspecified: Secondary | ICD-10-CM

## 2020-08-21 DIAGNOSIS — Z23 Encounter for immunization: Secondary | ICD-10-CM

## 2020-08-21 DIAGNOSIS — I119 Hypertensive heart disease without heart failure: Secondary | ICD-10-CM

## 2020-08-21 DIAGNOSIS — E1129 Type 2 diabetes mellitus with other diabetic kidney complication: Secondary | ICD-10-CM

## 2020-08-21 NOTE — Progress Notes (Signed)
[facility]                       Date of Exam: 08/21/2020 7:59 AM        Patient ID: Vincent Miller is a 59 y.o. male.  Attending Physician: Delfin Gant, MD        Chief Complaint:    Chief Complaint   Patient presents with   . Annual Exam               HPI:    Visit Type: Health Maintenance Visit  Reported Health: good health  Reported Diet: compliant with well-balanced diet and less fried foods  Reported Exercise: very active at work  Dental: regular dental visits twice a year  Vision: no vision correction needed  Hearing: normal hearing     Hypertensive heart disease, with nl BP outside of medical settings. CAD stable.     Mixed HLD, without arthralgias or myalgias on 20 mg Atorva.; had SEs on higher statin doses prn.    Type 2 DM, stable. Some microalbuminuria.           Problem List:    Patient Active Problem List   Diagnosis   . Acute myocardial infarction   . Allergic rhinitis   . Coronary atherosclerosis   . ED (erectile dysfunction) of organic origin   . Gastroesophageal reflux disease   . Hypertensive heart disease   . Mixed hyperlipidemia   . Type 2 diabetes mellitus with microalbuminuria, without long-term current use of insulin   . Vitamin D deficiency   . Hiatal hernia             Current Meds:    Outpatient Medications Marked as Taking for the 08/21/20 encounter (Office Visit) with Delfin Gant, MD   Medication Sig Dispense Refill   . Aspirin Low Dose 81 MG EC tablet TAKE 1 TABLET BY MOUTH  EVERY DAY 90 tablet 1   . atorvastatin (LIPITOR) 20 MG tablet TAKE 1 TABLET BY MOUTH  DAILY 90 tablet 0   . fluocinonide (LIDEX) 0.05 % ointment fluocinonide 0.05 % topical ointment     . metFORMIN (GLUCOPHAGE-XR) 500 MG 24 hr tablet TAKE 1 TABLET BY MOUTH  TWICE DAILY 180 tablet 0   . metoprolol succinate XL (TOPROL-XL) 50 MG 24 hr tablet TAKE 1 TABLET BY MOUTH  EVERY 24 HOURS 90 tablet 0   . omeprazole (PriLOSEC) 20 MG capsule TAKE 1 CAPSULE BY MOUTH  DAILY  90 capsule 0   . tadalafil (CIALIS) 20 MG tablet TAKE ONE TABLET BY MOUTH EVERY DAY 1 HOUR BEFORE NEEDED 10 tablet 11   . valsartan-hydroCHLOROthiazide (DIOVAN-HCT) 320-25 MG per tablet TAKE 1 TABLET BY MOUTH  DAILY 90 tablet 0   . [DISCONTINUED] valsartan (DIOVAN) 40 MG tablet 40 mg            Allergies:    Allergies   Allergen Reactions   . Statins Other (See Comments)     Joint aches   . Penicillins Rash     Turned purple             Past Surgical History:    Past Surgical History:   Procedure Laterality Date   . CARDIAC CATHETERIZATION  2008   . COLONOSCOPY  09/05/2019    Dr. Lelon Huh. 2 small polyps, multiple sigmoid diverticula   . ENDOSCOPY NASAL N/A 05/13/2016    Procedure: ENDOSCOPY NASAL;  Surgeon: Arnell Asal, MD;  Location: [facility] OR;  Service: ENT;  Laterality: N/A;  DISE, NASAL ENDOSCOPY, SEPTO, TURBS*        . MOUTH SURGERY     . SEPTOPLASTY SMR, TURBINATES N/A 05/13/2016    Procedure: SEPTOPLASTY SMR, TURBINATES;  Surgeon: Arnell Asal, MD;  Location: [facility] OR;  Service: ENT;  Laterality: N/A;           Family History:    Family History   Adopted: Yes   Family history unknown: Yes           Social History:    Social History     Tobacco Use   . Smoking status: Former Smoker     Packs/day: 0.50     Years: 30.00     Pack years: 15.00     Quit date: 11/01/2014     Years since quitting: 5.8   . Smokeless tobacco: Never Used   Vaping Use   . Vaping Use: Never used   Substance Use Topics   . Alcohol use: Yes     Alcohol/week: 10.0 standard drinks     Types: 10 Shots of liquor per week   . Drug use: No          The following sections were reviewed this encounter by the provider:            Vital Signs:    BP 150/88 (BP Site: Right arm, Patient Position: Sitting)   Pulse 94   Temp 97.3 F (36.3 C) (Tympanic)   Ht 1.829 m (6')   Wt 105.7 kg (233 lb)   SpO2 99%   BMI 31.60 kg/m          ROS:    Review of Systems   Constitutional: Negative for fatigue.   HENT: Negative for congestion,  hearing loss and tinnitus.    Eyes: Negative for visual disturbance.   Respiratory: Positive for apnea. Negative for shortness of breath.    Cardiovascular: Negative for chest pain, palpitations and leg swelling.   Gastrointestinal: Negative for abdominal pain, constipation and diarrhea.   Endocrine:        ED   Genitourinary: Positive for decreased urine volume and frequency (nocturia x 1).   Musculoskeletal: Positive for arthralgias (feet pain, hips, knees also).   Skin: Negative for rash.   Neurological: Negative for dizziness, weakness and numbness.   Hematological: Negative for adenopathy. Does not bruise/bleed easily.   Psychiatric/Behavioral: Negative for dysphoric mood and sleep disturbance. The patient is not nervous/anxious.              Physical Exam:    Physical Exam  Constitutional:       Appearance: Normal appearance.   HENT:      Head: Normocephalic and atraumatic.      Right Ear: Tympanic membrane normal.      Left Ear: Tympanic membrane normal.      Nose: Nose normal.      Mouth/Throat:      Mouth: Mucous membranes are moist.      Pharynx: Oropharynx is clear.   Eyes:      Extraocular Movements: Extraocular movements intact.      Conjunctiva/sclera: Conjunctivae normal.      Pupils: Pupils are equal, round, and reactive to light.   Neck:      Thyroid: No thyromegaly.   Cardiovascular:      Rate and Rhythm: Normal rate and regular rhythm.  Pulses: Normal pulses.      Heart sounds: No murmur heard.  No friction rub. No gallop.    Pulmonary:      Effort: Pulmonary effort is normal.      Breath sounds: Normal breath sounds.   Abdominal:      General: Abdomen is flat. Bowel sounds are normal. There is no distension.      Palpations: Abdomen is soft.      Tenderness: There is no abdominal tenderness.   Genitourinary:     Penis: Normal.       Testes: Normal.   Musculoskeletal:         General: Normal range of motion.      Cervical back: Normal range of motion and neck supple.      Right lower leg: No  edema.      Left lower leg: No edema.   Lymphadenopathy:      Cervical: No cervical adenopathy.   Skin:     General: Skin is warm and dry.      Findings: No rash.   Neurological:      General: No focal deficit present.      Mental Status: He is alert and oriented to person, place, and time.   Psychiatric:         Mood and Affect: Mood normal.         Behavior: Behavior normal.         Thought Content: Thought content normal.         Judgment: Judgment normal.                Assessment:    1. Well adult exam    2. Type 2 diabetes mellitus with microalbuminuria, without long-term current use of insulin    3. Immunization due  - Flu vaccine QUADRIVALENT (PF) 6 months and older (FLULAVAL/FLUARIX)  - COVID-19 mRNA vaccine preservative free 0.3 mL (PFIZER)    4. Hypertensive heart disease without heart failure    5. Mixed hyperlipidemia            Plan:    Continue meds.  Check FBW before f/u.  Monitor BP. Marland Kitchen  Encouraged diet/exercise/wt loss.  F/U 6 months.            Follow-up:    No follow-ups on file.         Delfin Gant, MD

## 2020-08-29 ENCOUNTER — Encounter (INDEPENDENT_AMBULATORY_CARE_PROVIDER_SITE_OTHER): Payer: Self-pay | Admitting: Family Medicine

## 2020-09-09 ENCOUNTER — Other Ambulatory Visit (INDEPENDENT_AMBULATORY_CARE_PROVIDER_SITE_OTHER): Payer: Self-pay | Admitting: Family Medicine

## 2020-09-09 ENCOUNTER — Encounter (INDEPENDENT_AMBULATORY_CARE_PROVIDER_SITE_OTHER): Payer: Self-pay

## 2020-09-09 ENCOUNTER — Other Ambulatory Visit (INDEPENDENT_AMBULATORY_CARE_PROVIDER_SITE_OTHER): Payer: Self-pay | Admitting: Cardiovascular Disease

## 2020-09-09 DIAGNOSIS — I2510 Atherosclerotic heart disease of native coronary artery without angina pectoris: Secondary | ICD-10-CM

## 2020-09-09 DIAGNOSIS — I119 Hypertensive heart disease without heart failure: Secondary | ICD-10-CM

## 2020-09-09 DIAGNOSIS — E119 Type 2 diabetes mellitus without complications: Secondary | ICD-10-CM

## 2020-09-09 DIAGNOSIS — K219 Gastro-esophageal reflux disease without esophagitis: Secondary | ICD-10-CM

## 2020-09-10 ENCOUNTER — Encounter (INDEPENDENT_AMBULATORY_CARE_PROVIDER_SITE_OTHER): Payer: Self-pay

## 2020-09-10 NOTE — Telephone Encounter (Signed)
Asked pt to call [facility] as we have not seen him since 06/2019. Last OV from [facility] expressed pt was on vascepa not lovaza, last OV from pt's PCP (08/21/20) states pt not on vascepa or lovazaz. I want pt to call us so we can clarify his meds.

## 2020-10-10 ENCOUNTER — Encounter (INDEPENDENT_AMBULATORY_CARE_PROVIDER_SITE_OTHER): Payer: Self-pay

## 2020-10-11 ENCOUNTER — Encounter (INDEPENDENT_AMBULATORY_CARE_PROVIDER_SITE_OTHER): Payer: Self-pay

## 2020-11-04 ENCOUNTER — Other Ambulatory Visit (INDEPENDENT_AMBULATORY_CARE_PROVIDER_SITE_OTHER): Payer: Self-pay | Admitting: Family Medicine

## 2020-11-04 DIAGNOSIS — I119 Hypertensive heart disease without heart failure: Secondary | ICD-10-CM

## 2020-11-10 ENCOUNTER — Encounter (INDEPENDENT_AMBULATORY_CARE_PROVIDER_SITE_OTHER): Payer: Self-pay

## 2020-11-10 ENCOUNTER — Encounter (INDEPENDENT_AMBULATORY_CARE_PROVIDER_SITE_OTHER): Payer: Self-pay | Admitting: Family Medicine

## 2020-11-11 ENCOUNTER — Encounter (INDEPENDENT_AMBULATORY_CARE_PROVIDER_SITE_OTHER): Payer: Self-pay

## 2021-01-05 NOTE — H&P (Signed)
[facility] OFFICE  [address] [place], [place] [address]           Maximino Sarin    Date of Visit:  07/03/2019  Date of Birth: 07/21/1961  Age: 60 yrs.   Medical Record Number: [id number]  Referring Physician: Excell Seltzer MD, JASON A.  __   CURRENT DIAGNOSES     1. Hyperlipidemia, mixed, E78.2  2. CAD with unstable angina pectoris, I25.110  __  ALLERGIES     Penicillins, Hives and/or rash  __  MEDICATIONS     1. aspirin 81 mg tablet,delayed release,  1 po qd  2. valsartan 320 mg-hydrochlorothiazide 25 mg tablet, 1 po qd  3. omeprazole 20 mg capsule,delayed release, 1 po qd  4. metformin 500 mg tablet, 1 po bid  5. atorvastatin 20 mg tablet, 1 po qd  6. metoprolol succinate ER 50  mg tablet,extended release 24 hr, 1 po qd  7. fluocinonide 0.05 % topical ointment, use as directed  8. Cialis 20 mg tablet, PRN  9. Vascepa 1 Gram Capsule, take two tablets twice daily with food  __   CHIEF COMPLAINT/REASON FOR VISIT  hypertriglyceridemia  __  HISTORY OF PRESENT ILLNESS     Very pleasant 60 year old gentleman presents to our clinic as a referral from Dr. Excell Seltzer for evaluation/consultation of hypertriglyceridemia and history of coronary artery disease. Patient has no acute complaints today. Patient works in [employer]. He is quite busy on his feet, also in a motorcycle group. He regularly walks 15,000 steps per day. No regimented exercise outside of this. No angina, dyspnea on exertion, syncope, presyncope, palpitations, lower extremity edema. He does get  occasional foot and ankle discomfort when he has been on his feet for long period of time. He relates a history of approximately 15 years ago presenting to the hospital with fatigue and elevated blood pressure. He underwent cardiac cath and was told that  he had an anatomic abnormality that resembled the result of a trauma to the chest rather than coronary artery disease. This was treated with PCI. He has had no complications since that time.  Patient used to be a heavy alcohol drinker, less frequently  now. Non-smoker. Adopted, so no known family medical history. He underwent regular stress test after his PCI for several years and they were all normal. None in several years    __  PAST HISTORY      Past Medical Illnesses: hyperlipidemia, hypertension, diabetes, GERD, vitamin d deficiency;  Past Cardiac  Illnesses: acute MI; Infectious Diseases: No previous history of significant infectious diseases.;  Surgical Procedures: endoscopy nasal, mouth surgery, septoplasty; Trauma History: No previous history of  significant trauma.; Cardiology Procedures-Invasive: Cardiac Cath 2008; Cardiology Procedures-Noninvasive : No previous non-invasive cardiovascular testing.    __  CARDIAC RISK FACTORS     Tobacco  Abuse: used to smoke, but quit; Family History of Heart Disease: no family history of cardiovascular  disease; Hyperlipidemia: positive; Hypertension : positive;  Diabetes Mellitus: positive; Prior History of Heart Disease : negative; Obesity: negative; Sedentary Life Style :negative; UXL:[id number]; Menopausal:not applicable   __  SOCIAL HISTORY    Alcohol Use : Does not use alcohol; Smoking: Does not smoke; Former smoker [phone] Diet : Regular diet; Exercise: Exercises daily;   __  REVIEW OF SYSTEMS     All systems reviewed and negative other than noted in HPI    __  PHYSICAL EXAMINATION     Vital Signs:  Blood Pressure:  132/90 Sitting, Right arm, large cuff    Weight:  234.20 lbs.  Height: 72"  BMI: 31.76    Pulse: 89/min. Apical Regular       Constitutional: Cooperative, alert and oriented,well developed,  well nourished, in no acute distress. Skin: Warm and dry to touch, no apparent skin lesions, or masses noted.  Head: Normocephalic, normal hair pattern, no masses or tenderness Eyes: EOMS Intact, PERRL, conjunctivae  and lids normal. Funduscopic exam and visual fields not performed. ENT: Ears, Nose and throat reveal no gross abnormalities. No pallor  or cyanosis. Dentition  good. Neck: No palpable masses or adenopathy, no thyromegaly, no JVD, carotid pulses are full and equal bilaterally without bruits.  Chest: Normal symmetry, no tenderness to palpation, normal respiratory excursion, no intercostal retraction, no use of accessory muscles, normal diaphragmatic excursion, clear to auscultation and percussion.  Cardiac: Regular rhythm, S1 normal, S2 normal, No S3 or S4, Apical impulse not displaced, no murmurs, gallops or rubs detected. Abdomen : Abdomen soft, bowel sounds normoactive, no masses, no hepatosplenomegaly, non-tender, no bruits Peripheral Pulses: The femoral, popliteal, dorsalis  pedis, and posterior tibial pulses are full and equal bilaterally with no bruits auscultated. Extremities/Back: No deformities, clubbing, cyanosis, erythema  or edema observed. There are no spinal abnormalities noted. Normal muscle strength and tone. Neurological: No gross motor or sensory deficits noted,  affect appropriate, oriented to time, person and place.   __    Medications added today by the physician:  Vascepa 1 Gram Capsule, take two tablets twice daily with food, 120      IMPRESSIONS  1. History of PCI approximately  2005. Unknown etiology, as the patient was told that his anatomy resembled the result of chest wall trauma rather than coronary artery disease. Predominant symptoms at the time were fatigue and elevated blood pressure. Currently asymptomatic  2. Hypertriglyceridemia,  fasting triglycerides greater than 500  3. Hyperlipidemia with LDL well controlled (48) on atorvastatin  4. History of heavy EtOH, moderate use now  5. Diabetes, controlled on  6. Obesity  7. Bilateral foot and ankle discomfort with  exertion    PLAN  1. Presented patient with lifestyle recommendations to lower triglycerides by [employer] cardiology. This involves reducing alcohol intake, increasing exercise, and dietary changes which are mostly consistent with Mediterranean   diet.  2. Prescription for Vascepa given elevated triglycerides greater than 500 despite statin therapy and history of coronary artery disease  3. Refer patient for ABIs given history of coronary artery disease and foot discomfort  4. Since  patient is asymptomatic, no stress test ordered at this time. We will continue to follow closely      Florian Buff. Dimple Casey, MD  cc: Delfin Gant MD  ____________________________  Christianne Dolin  Diet_mgmt_edu,_guidance_and_counseling  TODAY  12 Lead ECG Today  Return Visit 15 MIN Tomorrow  Resting ABI [PVL] today

## 2021-01-08 ENCOUNTER — Encounter (INDEPENDENT_AMBULATORY_CARE_PROVIDER_SITE_OTHER): Payer: Self-pay

## 2021-01-09 ENCOUNTER — Encounter (INDEPENDENT_AMBULATORY_CARE_PROVIDER_SITE_OTHER): Payer: Self-pay

## 2021-01-28 ENCOUNTER — Other Ambulatory Visit (INDEPENDENT_AMBULATORY_CARE_PROVIDER_SITE_OTHER): Payer: Self-pay | Admitting: Family Medicine

## 2021-01-28 DIAGNOSIS — I119 Hypertensive heart disease without heart failure: Secondary | ICD-10-CM

## 2021-01-29 NOTE — Telephone Encounter (Signed)
Please schedule follow up visit.

## 2021-01-29 NOTE — Telephone Encounter (Signed)
Patient last annual exam OV on 08/21/2020.    Please assess .    Pending valsartan-hydroCHLOROthiazide (DIOVAN-HCT) 320-25 MG per tablet .    Thank you.

## 2021-02-07 ENCOUNTER — Encounter (INDEPENDENT_AMBULATORY_CARE_PROVIDER_SITE_OTHER): Payer: Self-pay

## 2021-03-11 ENCOUNTER — Encounter (INDEPENDENT_AMBULATORY_CARE_PROVIDER_SITE_OTHER): Payer: Self-pay

## 2021-04-01 ENCOUNTER — Other Ambulatory Visit (INDEPENDENT_AMBULATORY_CARE_PROVIDER_SITE_OTHER): Payer: Self-pay | Admitting: Family Medicine

## 2021-04-01 DIAGNOSIS — I119 Hypertensive heart disease without heart failure: Secondary | ICD-10-CM

## 2021-04-01 DIAGNOSIS — I2510 Atherosclerotic heart disease of native coronary artery without angina pectoris: Secondary | ICD-10-CM

## 2021-04-01 DIAGNOSIS — E119 Type 2 diabetes mellitus without complications: Secondary | ICD-10-CM

## 2021-04-01 NOTE — Telephone Encounter (Signed)
Medications:  Metformin XR 500 mg BID, atorvastatin 20 mg QD and Toprol - XL Q day    Annual exam done on 08/21/2020 with Dr Excell Seltzer.    90 day supply pended as pt is due for a f/u chronic care visit.      Please review and authorize if appropriate.     Thank you    Clerical staff:  Please contact pt to schedule a f/u chronic care visit.  Thank you

## 2021-05-26 IMAGING — MR MRI RIGHT FOOT WITHOUT CONTRAST
6 of 7 series · 27 of 40 positions shown · IV contrast (gadolinium)
Comparison: None.

________________________________________________________________________________________________ 
MRI RIGHT FOOT WITHOUT CONTRAST, 05/26/2021 [DATE]: 
CLINICAL INDICATION: Bilateral Achilles ruptures. Chronic Achilles pain without 
surgery. Smoking cessation 7778.
TECHNIQUE: Multiplanar, multiecho position MR images of the right foot were 
performed without intravenous gadolinium enhancement. Patient was scanned on a 
1.5T magnet.

[Series 101: survey_fullfov_transversal · axial · 10.0mm · 1.84mm/px · 1 of 7 slices shown]
[im 1/7]
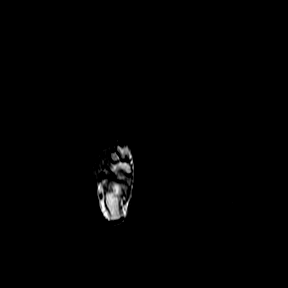

[Series 201: survey_right · axial · 10.0mm · 1.17mm/px · z∈[-20,+149]mm · 2 of 9 slices shown]
[im 1/9]
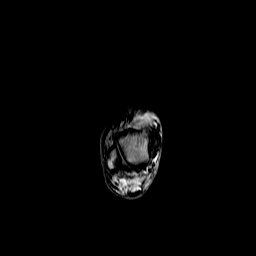
[im 9/9]
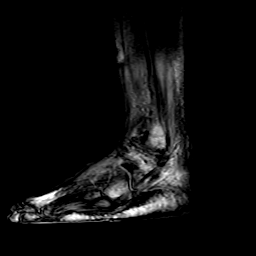

[Series 301: (person_name)_(person_name)_(person_name) · axial · 3.0mm · 0.34mm/px · z∈[-113,+53]mm · 11 of 50 slices shown]
[im 1/50]
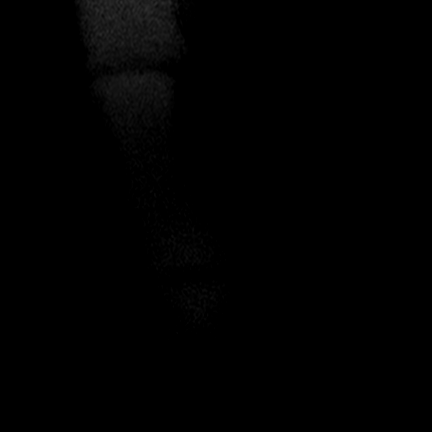
[im 5/50]
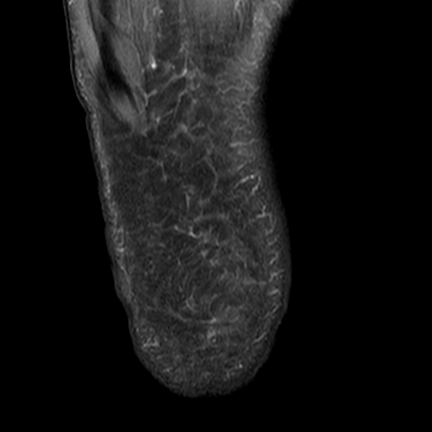
[im 10/50]
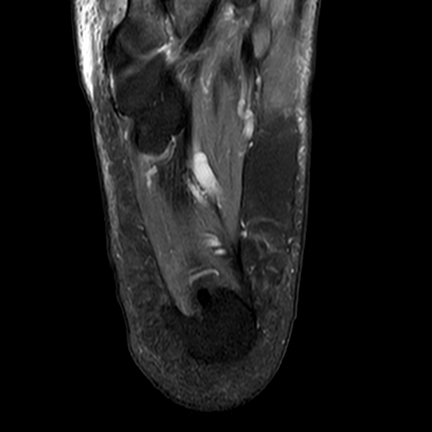
[im 15/50]
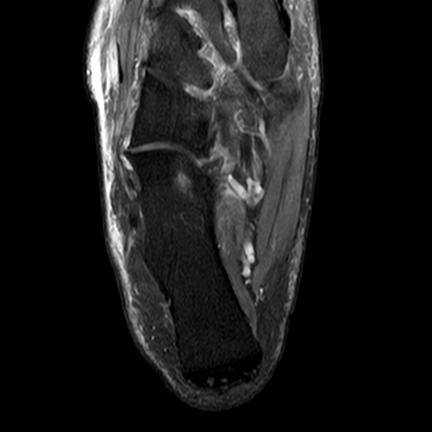
[im 20/50]
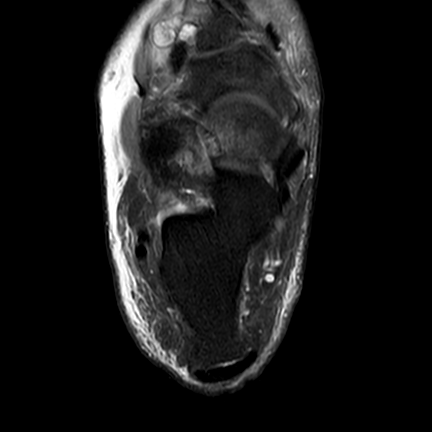
[im 25/50]
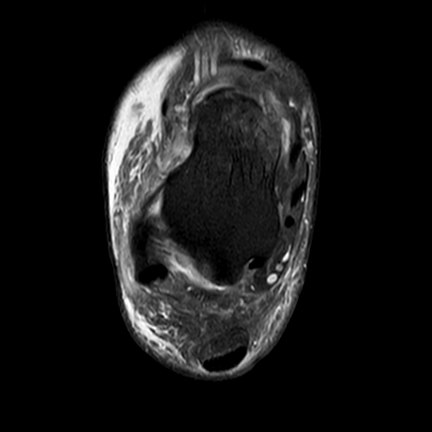
[im 30/50]
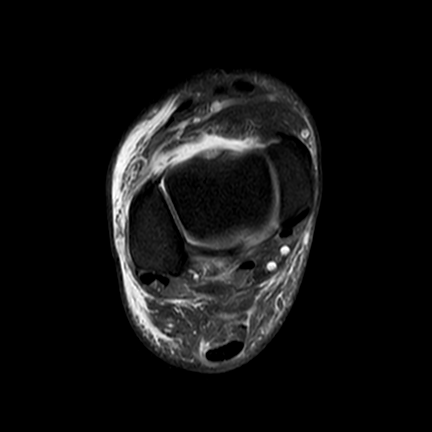
[im 35/50]
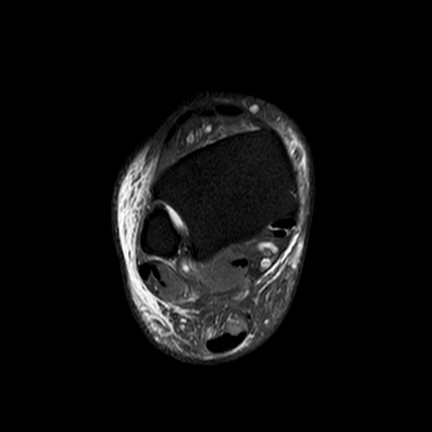
[im 40/50]
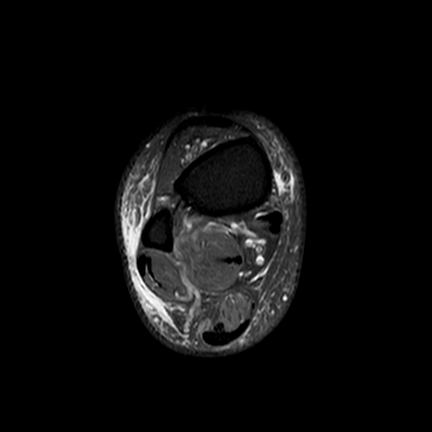
[im 45/50]
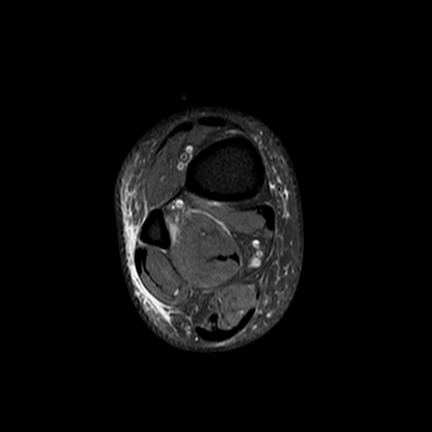
[im 50/50]
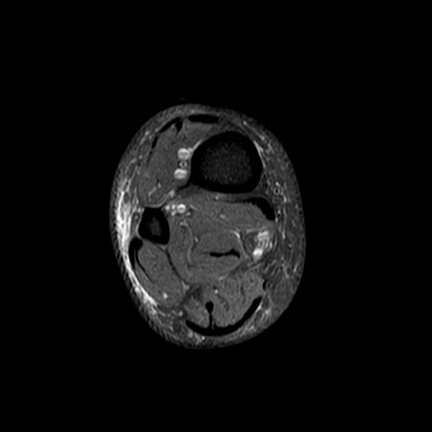

[Series 401: t1_sag · sagittal · 2.5mm · 0.31mm/px · 6 of 27 slices shown]
[im 1/27]
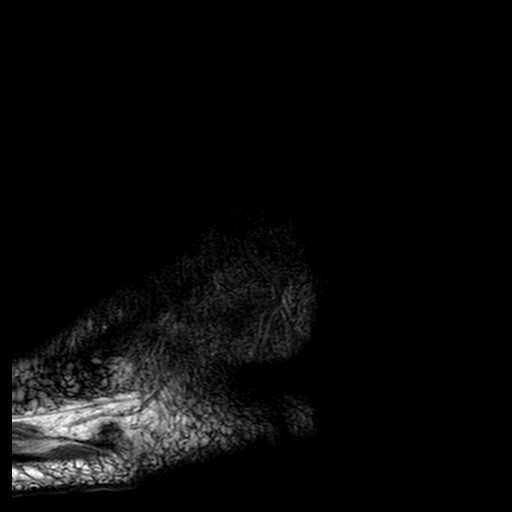
[im 6/27]
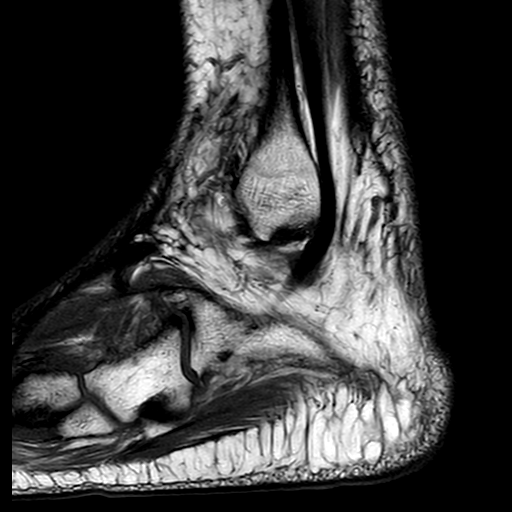
[im 11/27]
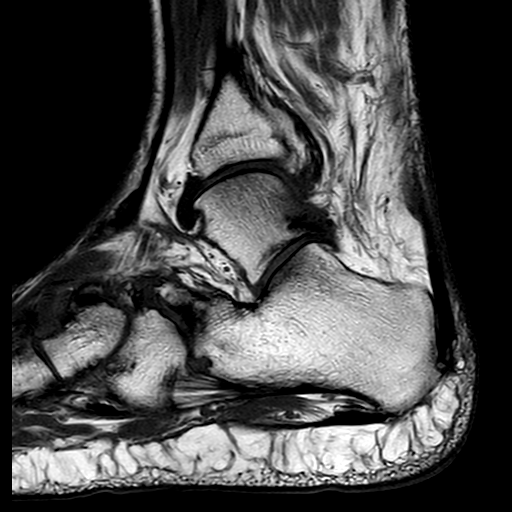
[im 16/27]
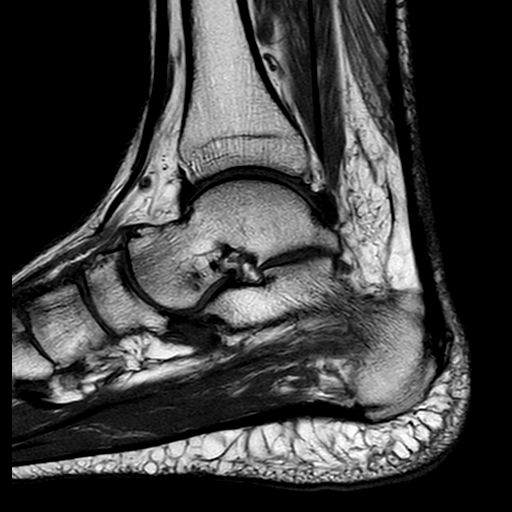
[im 21/27]
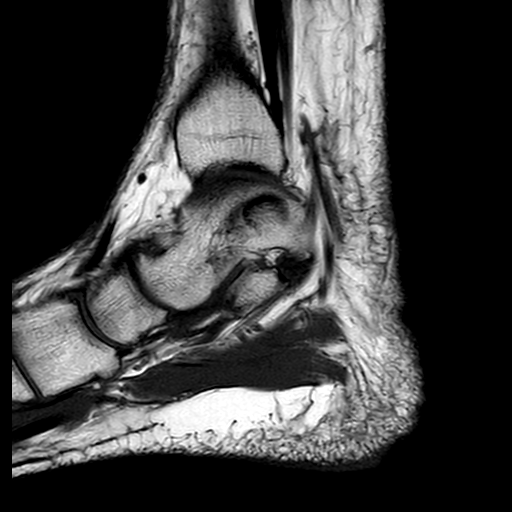
[im 27/27]
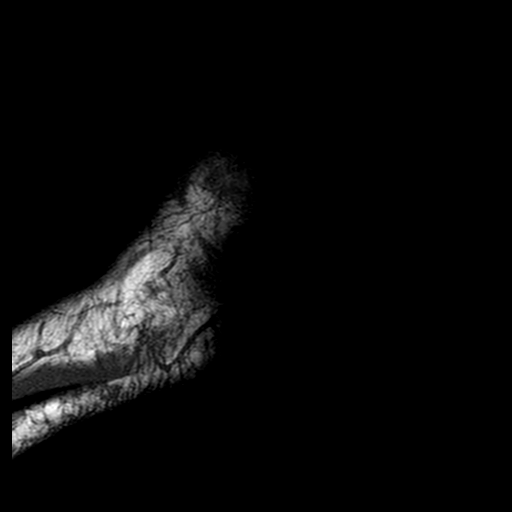

[Series 501: pd_fs_sag · sagittal · 2.5mm · 0.45mm/px · 6 of 27 slices shown]
[im 1/27]
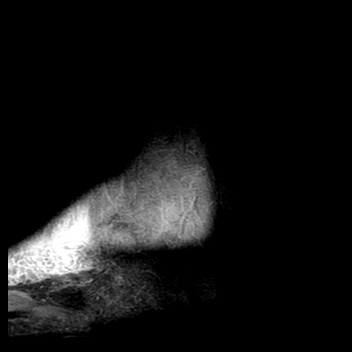
[im 6/27]
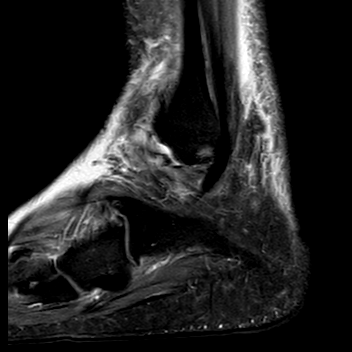
[im 11/27]
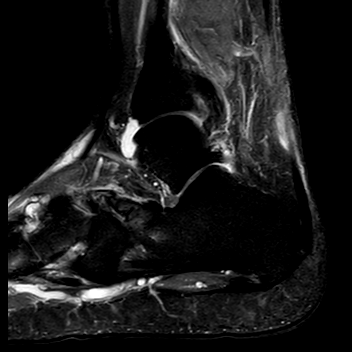
[im 16/27]
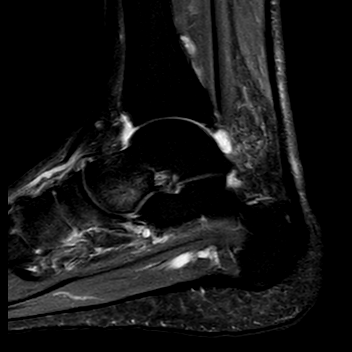
[im 21/27]
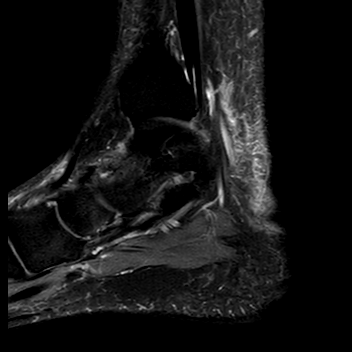
[im 27/27]
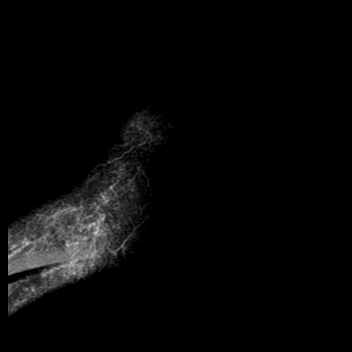

[Series 601: pd_fs_cor · coronal · 3.0mm · 0.44mm/px · 1 of 36 slices shown]
[im 1/36]
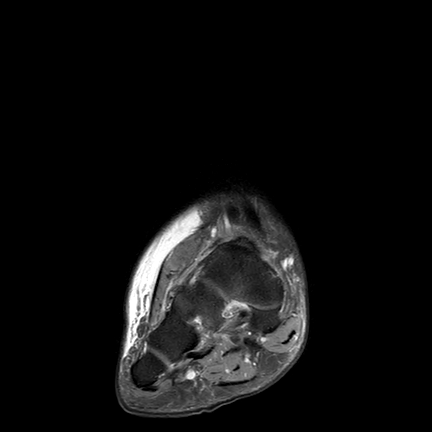

[27 of 40 positions shown; findings below may reference images not displayed]

FINDINGS: RIGHT FOOT: 
TENDONS: The Achilles tendon is preserved. No tendinosis, interstitial, 
insertional or myotendinous unit Achilles tear. The flexor, extensor and 
peroneal tendons are intact. No tendon tear, tenosynovitis or tendinopathy. No 
tendon subluxation. 
LIGAMENTS:  
LATERAL LIGAMENTS:The anterior talofibular ligament is intact. The 
calcaneofibular ligament and posterior talofibular ligament are preserved. 
SYNDESMOTIC LIGAMENTS:The anterior and posterior tibiofibular and interosseous 
ligaments are preserved. 
DELTOID LIGAMENTOUS COMPLEX:The deep and superficial components of the deltoid 
ligamentous complex are intact. 
SINUS TARSI LIGAMENTS: The cervical and interosseous ligaments are preserved. 
The inferior extensor retinaculum appears intact.  
BONES AND JOINTS: There is scattered articular cartilaginous loss with scattered 
osteophytic spurring greatest involving the mid foot. There is scattered mild 
reactive marrow edema. No fracture. Normal alignment. Talar dome is preserved. 
No focal osteochondral lesion. There are plantar and Achilles calcaneal 
enthesophytes. Small tibiotalar effusion. 
MUSCLES: Musculature is symmetric without mass, signal abnormality or atrophy.  
OTHER SOFT TISSUES: There is a dorsal ganglion associated with the lateral 
midfoot measuring approximately 1.5 cm TR by 1.2 cm AP by 2 cm longitudinally. 
This is seen for example on the sagittal series, images 17 through 21. Tarsal 
tunnel is preserved. Sinus tarsi fat is preserved. Plantar fascia is intact. 
There is scattered nonspecific edema within the subcutaneous tissues.
IMPRESSION: RIGHT FOOT 
1.  Moderate degenerative changes. 
2.  Dorsal ganglion associated with the lateral midfoot. 
3.  Small ankle effusion. 
4.  Calcaneal enthesophytes. 
5.  No Achilles tendon tear.

## 2021-05-26 IMAGING — MR MRI LEFT FOOT WITHOUT CONTRAST
6 of 7 series · 27 of 40 positions shown · IV contrast (gadolinium)
Comparison: None.

________________________________________________________________________________________________ 
MRI LEFT FOOT WITHOUT CONTRAST, 05/26/2021 [DATE]: 
CLINICAL INDICATION: Bilateral Achilles ruptures. Chronic Achilles pain without 
surgery. Smoking cessation 9927.
TECHNIQUE: Multiplanar, multiecho position MR images of the left foot were 
performed without intravenous gadolinium enhancement. Patient was scanned on a 
1.5T magnet.

[Series 101: survey_fullfov_transversal · axial · 10.0mm · 1.84mm/px · 1 of 7 slices shown]
[im 1/7]
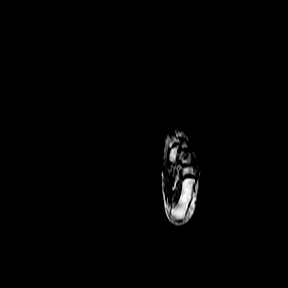

[Series 201: survey_left · axial · 10.0mm · 1.17mm/px · z∈[-20,+149]mm · 2 of 9 slices shown]
[im 1/9]
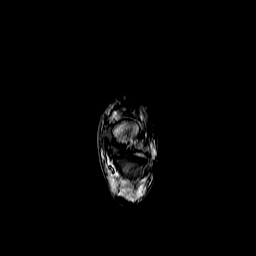
[im 9/9]
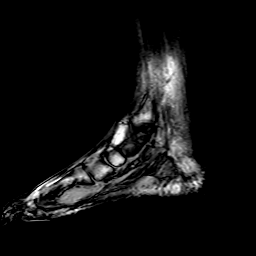

[Series 301: (person_name)_(person_name)_(person_name) · axial · 3.0mm · 0.34mm/px · z∈[-93,+72]mm · 11 of 50 slices shown]
[im 1/50]
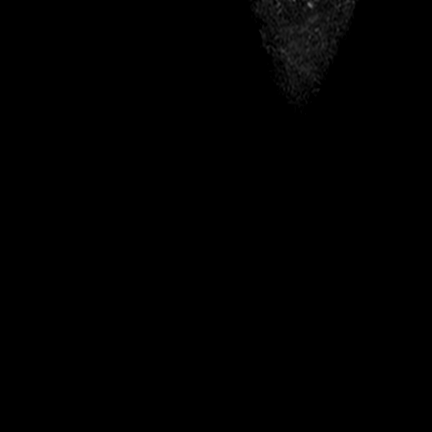
[im 5/50]
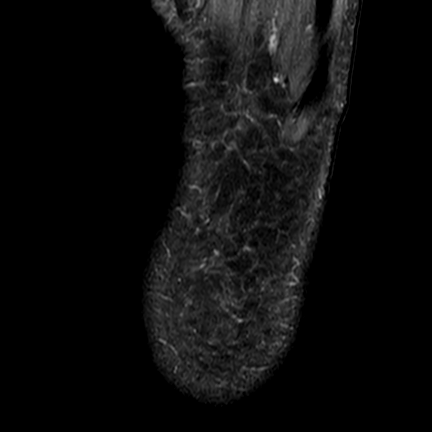
[im 10/50]
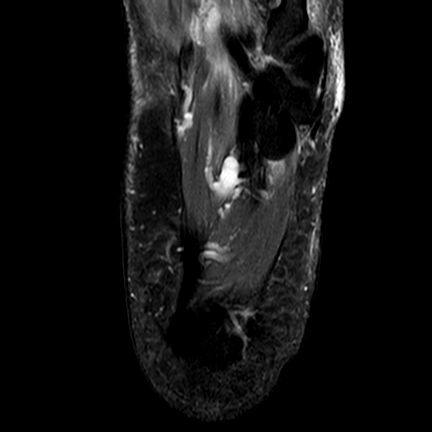
[im 15/50]
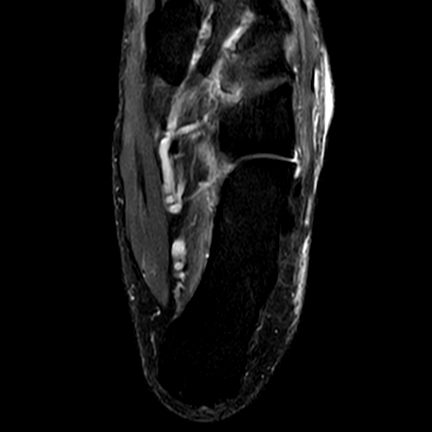
[im 20/50]
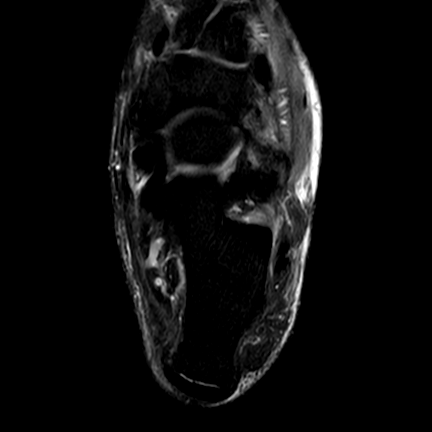
[im 25/50]
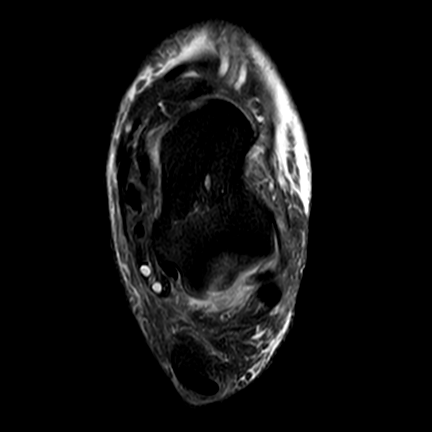
[im 30/50]
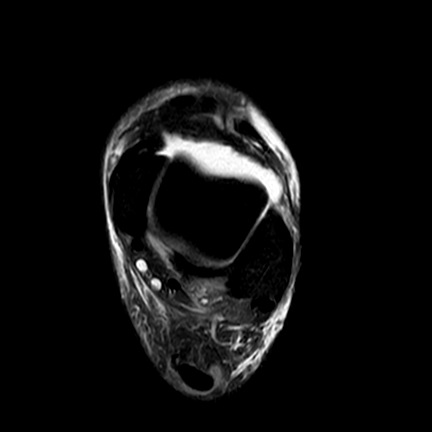
[im 35/50]
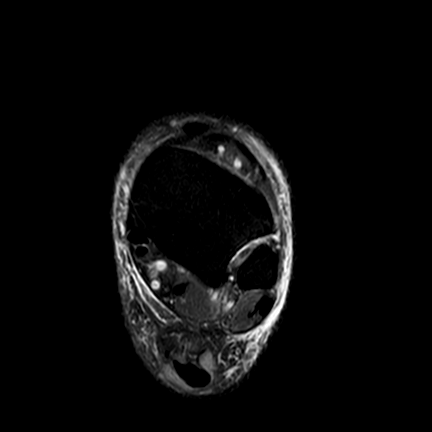
[im 40/50]
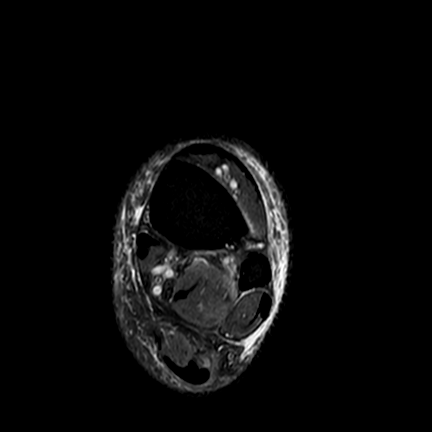
[im 45/50]
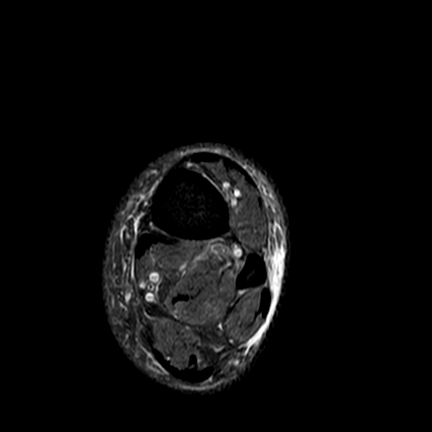
[im 50/50]
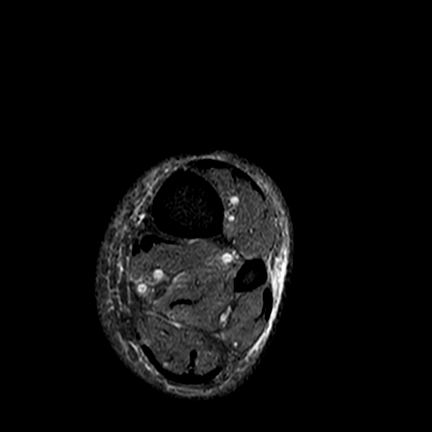

[Series 401: t1_sag · sagittal · 2.5mm · 0.31mm/px · 6 of 27 slices shown]
[im 1/27]
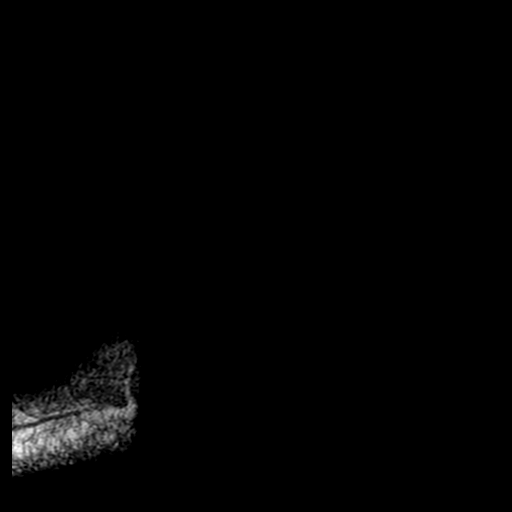
[im 6/27]
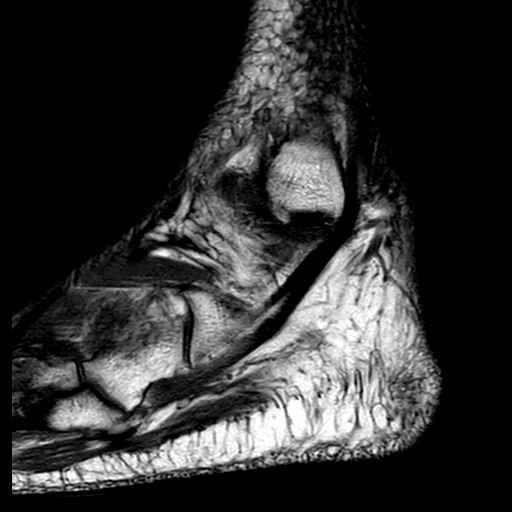
[im 11/27]
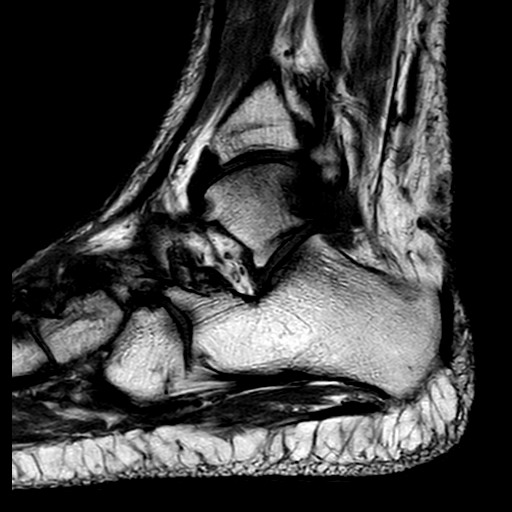
[im 16/27]
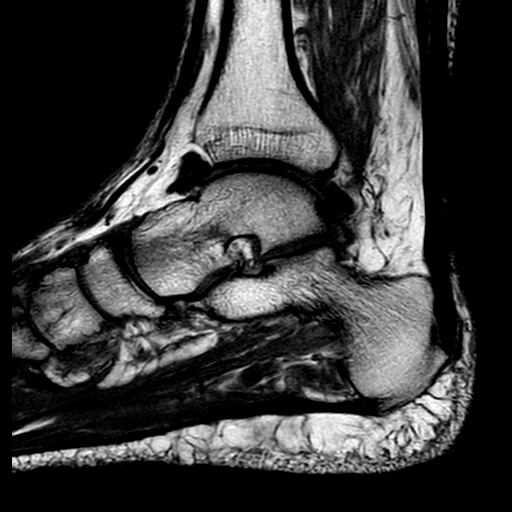
[im 21/27]
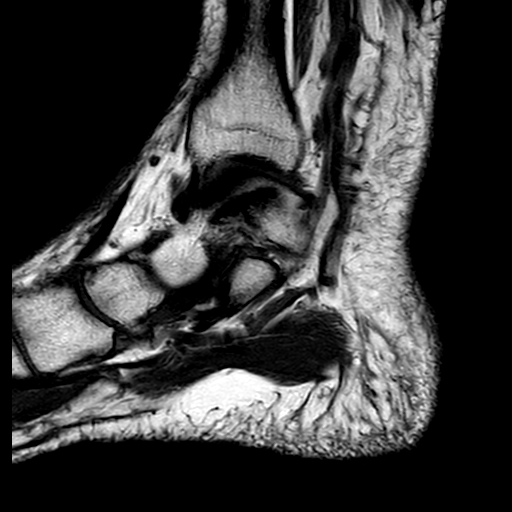
[im 27/27]
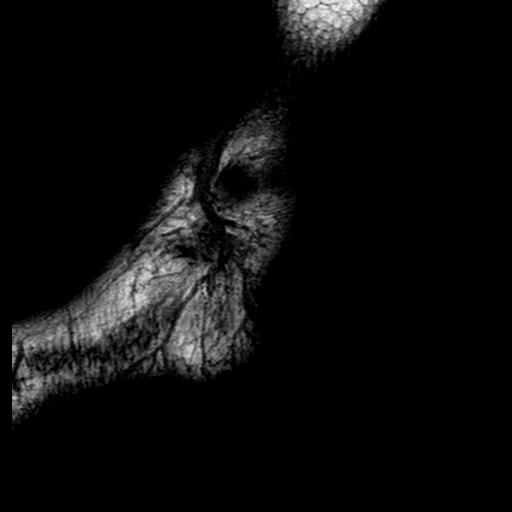

[Series 501: pd_fs_sag · sagittal · 2.5mm · 0.45mm/px · 6 of 27 slices shown]
[im 1/27]
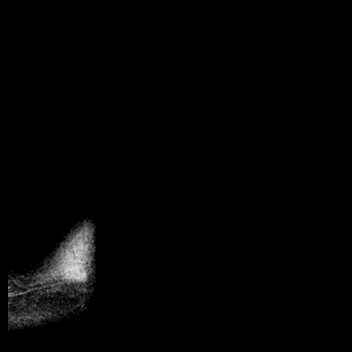
[im 6/27]
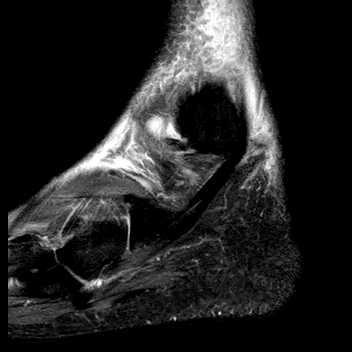
[im 11/27]
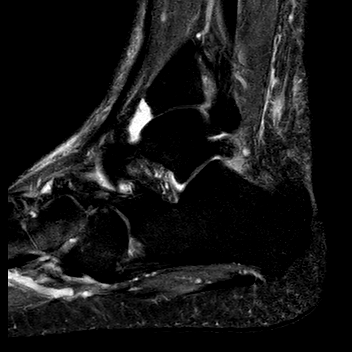
[im 16/27]
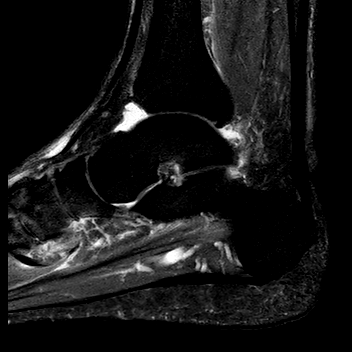
[im 21/27]
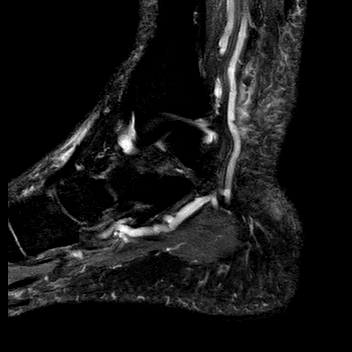
[im 27/27]
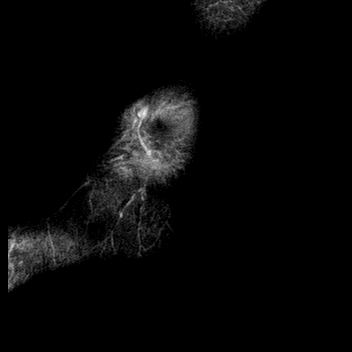

[Series 601: pd_fs_cor · coronal · 3.0mm · 0.44mm/px · 1 of 36 slices shown]
[im 1/36]
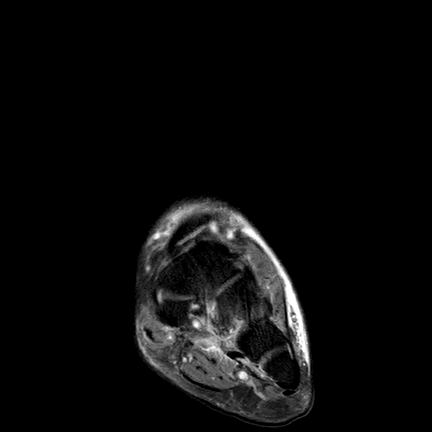

[27 of 40 positions shown; findings below may reference images not displayed]

FINDINGS: LEFT FOOT: 
TENDONS: The Achilles tendon is preserved. No tendinosis, interstitial tear, 
insertional or myotendinous unit Achilles tear. The flexor, extensor and 
peroneal tendons are intact. No tendon tear, tenosynovitis or tendinopathy. No 
tendon subluxation. 
LIGAMENTS:  
LATERAL LIGAMENTS:The anterior talofibular ligament is intact. The 
calcaneofibular ligament and posterior talofibular ligament are preserved. 
SYNDESMOTIC LIGAMENTS:The anterior and posterior tibiofibular and interosseous 
ligaments are preserved. 
DELTOID LIGAMENTOUS COMPLEX:The deep and superficial components of the deltoid 
ligamentous complex are intact. 
SINUS TARSI LIGAMENTS: The cervical and interosseous ligaments are preserved. 
The inferior extensor retinaculum appears intact.  
BONES AND JOINTS: There is scattered articular cartilaginous loss with scattered 
osteophytic spurring greatest involving the mid foot. Slight scattered reactive 
marrow edema. No fracture. Normal alignment. Talar dome is preserved. No focal 
osteochondral lesion. There are plantar and Achilles calcaneal enthesophytes. 
Small tibiotalar effusion. 
MUSCLES: Musculature is symmetric without mass, signal abnormality or atrophy.  
OTHER SOFT TISSUES: Tarsal tunnel is preserved. Sinus tarsi fat is preserved. 
Plantar fascia is intact. There is scattered nonspecific edema within the 
subcutaneous tissues.
IMPRESSION: LEFT FOOT 
1.  Moderate degenerative changes. 
2.  Small ankle effusion. 
3.  Calcaneal enthesophytes. 
4.  No Achilles tendon tear.

## 2021-08-04 IMAGING — NM THREE PHASE BONE SCAN
1 series · 6 of 6 positions shown · non-contrast
Comparison: 05/26/2021 MRI of the feet

________________________________________________________________________________________________ 
THREE PHASE BONE SCAN, 08/04/2021 [DATE]: 
CLINICAL INDICATION: Stress fracture left foot, initial encounter for fracture.
TECHNIQUE: After the intravenous administration of 27.9 mCi of FREDDY OMAR PcMMm MDP, 
blood flow, blood pool and delayed images were obtained over the feet. In 
addition whole-body images were obtained.

[Series 1000: flow · 4.80mm/px · 6 of 240 frames shown]
[frame 21/240]
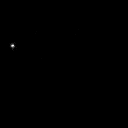
[frame 61/240  full-range]
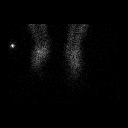
[frame 101/240  full-range]
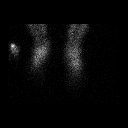
[frame 141/240]
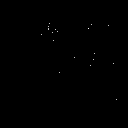
[frame 181/240  full-range]
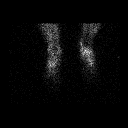
[frame 221/240]
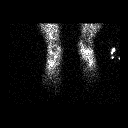

[6 of 6 positions shown; findings below may reference images not displayed]

FINDINGS: Physiologic uptake of radiotracer. Normal flow imaging to the feet. 
Multifocal moderate to severe radiotracer uptake within the left ankle and 
bilateral feet on immediate and delayed imaging. Urine contamination in the 
perineum. Otherwise, unremarkable.
IMPRESSION: Multifocal moderate to severe radiotracer uptake within the left ankle and 
bilateral feet: Multiple interval fractures and progressive degenerative change 
are considerations. Radiographs may be helpful for further evaluation.

## 2022-08-22 ENCOUNTER — Emergency Department
Admission: EM | Admit: 2022-08-22 | Discharge: 2022-08-22 | Disposition: A | Discharge status: Home / Self Care | Payer: BC Managed Care – PPO | Attending: Student in an Organized Health Care Education/Training Program | Admitting: Student in an Organized Health Care Education/Training Program

## 2022-08-22 ENCOUNTER — Emergency Department: Payer: BC Managed Care – PPO

## 2022-08-22 DIAGNOSIS — E860 Dehydration: Secondary | ICD-10-CM | POA: Insufficient documentation

## 2022-08-22 DIAGNOSIS — K529 Noninfective gastroenteritis and colitis, unspecified: Secondary | ICD-10-CM | POA: Insufficient documentation

## 2022-08-22 DIAGNOSIS — R112 Nausea with vomiting, unspecified: Secondary | ICD-10-CM

## 2022-08-22 DIAGNOSIS — R000 Tachycardia, unspecified: Secondary | ICD-10-CM

## 2022-08-22 DIAGNOSIS — Z20822 Contact with and (suspected) exposure to covid-19: Secondary | ICD-10-CM | POA: Insufficient documentation

## 2022-08-22 DIAGNOSIS — N179 Acute kidney failure, unspecified: Secondary | ICD-10-CM | POA: Insufficient documentation

## 2022-08-22 LAB — CBC AND DIFFERENTIAL
Absolute NRBC: 0 10*3/uL (ref 0.00–0.00)
Basophils Absolute Automated: 0.09 10*3/uL — ABNORMAL HIGH (ref 0.00–0.08)
Basophils Automated: 0.7 %
Eosinophils Absolute Automated: 0.25 10*3/uL (ref 0.00–0.44)
Eosinophils Automated: 1.9 %
Hematocrit: 42.6 % (ref 37.6–49.6)
Hgb: 14.6 g/dL (ref 12.5–17.1)
Immature Granulocytes Absolute: 0.08 10*3/uL — ABNORMAL HIGH (ref 0.00–0.07)
Immature Granulocytes: 0.6 %
Instrument Absolute Neutrophil Count: 9.95 10*3/uL — ABNORMAL HIGH (ref 1.10–6.33)
Lymphocytes Absolute Automated: 1.69 10*3/uL (ref 0.42–3.22)
Lymphocytes Automated: 13.1 %
MCH: 32.2 pg (ref 25.1–33.5)
MCHC: 34.3 g/dL (ref 31.5–35.8)
MCV: 93.8 fL (ref 78.0–96.0)
MPV: 10.9 fL (ref 8.9–12.5)
Monocytes Absolute Automated: 0.85 10*3/uL (ref 0.21–0.85)
Monocytes: 6.6 %
Neutrophils Absolute: 9.95 10*3/uL — ABNORMAL HIGH (ref 1.10–6.33)
Neutrophils: 77.1 %
Nucleated RBC: 0 /100 WBC (ref 0.0–0.0)
Platelets: 291 10*3/uL (ref 142–346)
RBC: 4.54 10*6/uL (ref 4.20–5.90)
RDW: 15 % (ref 11–15)
WBC: 12.91 10*3/uL — ABNORMAL HIGH (ref 3.10–9.50)

## 2022-08-22 LAB — COVID-19 (SARS-COV-2) & INFLUENZA  A/B, NAA (ROCHE LIAT)
Influenza A: NOT DETECTED
Influenza B: NOT DETECTED
SARS CoV 2 Overall Result: NOT DETECTED

## 2022-08-22 LAB — BASIC METABOLIC PANEL
Anion Gap: 11 (ref 5.0–15.0)
BUN: 45 mg/dL — ABNORMAL HIGH (ref 9.0–28.0)
CO2: 18 mEq/L (ref 17–29)
Calcium: 7.9 mg/dL — ABNORMAL LOW (ref 8.5–10.5)
Chloride: 113 mEq/L — ABNORMAL HIGH (ref 99–111)
Creatinine: 1.4 mg/dL (ref 0.5–1.5)
Glucose: 97 mg/dL (ref 70–100)
Potassium: 4.3 mEq/L (ref 3.5–5.3)
Sodium: 142 mEq/L (ref 135–145)
eGFR: 57.8 mL/min/{1.73_m2} — AB (ref >=60)

## 2022-08-22 LAB — URINALYSIS REFLEX TO MICROSCOPIC EXAM - REFLEX TO CULTURE
Bilirubin, UA: NEGATIVE
Blood, UA: NEGATIVE
Glucose, UA: NEGATIVE
Ketones UA: NEGATIVE
Leukocyte Esterase, UA: NEGATIVE
Nitrite, UA: NEGATIVE
Specific Gravity UA: 1.023 (ref 1.001–1.035)
Urine pH: 6 (ref 5.0–8.0)
Urobilinogen, UA: NORMAL mg/dL (ref 0.2–2.0)

## 2022-08-22 LAB — COMPREHENSIVE METABOLIC PANEL
ALT: 32 U/L (ref 0–55)
AST (SGOT): 26 U/L (ref 5–41)
Albumin/Globulin Ratio: 1.1 (ref 0.9–2.2)
Albumin: 4.5 g/dL (ref 3.5–5.0)
Alkaline Phosphatase: 113 U/L (ref 37–117)
Anion Gap: 16 — ABNORMAL HIGH (ref 5.0–15.0)
BUN: 47 mg/dL — ABNORMAL HIGH (ref 9.0–28.0)
Bilirubin, Total: 0.7 mg/dL (ref 0.2–1.2)
CO2: 19 mEq/L (ref 17–29)
Calcium: 9.2 mg/dL (ref 8.5–10.5)
Chloride: 108 mEq/L (ref 99–111)
Creatinine: 1.7 mg/dL — ABNORMAL HIGH (ref 0.5–1.5)
Globulin: 4 g/dL — ABNORMAL HIGH (ref 2.0–3.6)
Glucose: 123 mg/dL — ABNORMAL HIGH (ref 70–100)
Potassium: 4.7 mEq/L (ref 3.5–5.3)
Protein, Total: 8.5 g/dL — ABNORMAL HIGH (ref 6.0–8.3)
Sodium: 143 mEq/L (ref 135–145)
eGFR: 45.8 mL/min/{1.73_m2} — AB (ref >=60)

## 2022-08-22 LAB — LIPASE: Lipase: 44 U/L (ref 8–78)

## 2022-08-22 LAB — ECG 12-LEAD
Atrial Rate: 116 {beats}/min
QRS Duration: 106 ms

## 2022-08-22 LAB — GROUP A STREP, RAPID ANTIGEN: Group A Strep, Rapid Antigen: NEGATIVE

## 2022-08-22 MED ORDER — PANTOPRAZOLE SODIUM 40 MG IV SOLR
40.0000 mg | Freq: Once | INTRAVENOUS | Status: AC
Start: 2022-08-22 — End: 2022-08-22
  Administered 2022-08-22: 40 mg via INTRAVENOUS
  Filled 2022-08-22: qty 40

## 2022-08-22 MED ORDER — SODIUM CHLORIDE 0.9 % IV BOLUS
1000.0000 mL | Freq: Once | INTRAVENOUS | Status: AC
Start: 2022-08-22 — End: 2022-08-22
  Administered 2022-08-22: 1000 mL via INTRAVENOUS

## 2022-08-22 MED ORDER — IOHEXOL 350 MG/ML IV SOLN
100.0000 mL | Freq: Once | INTRAVENOUS | Status: AC | PRN
Start: 2022-08-22 — End: 2022-08-22
  Administered 2022-08-22: 100 mL via INTRAVENOUS

## 2022-08-22 MED ORDER — ONDANSETRON HCL 4 MG/2ML IJ SOLN
4.0000 mg | Freq: Once | INTRAMUSCULAR | Status: AC
Start: 2022-08-22 — End: 2022-08-22
  Administered 2022-08-22: 4 mg via INTRAVENOUS
  Filled 2022-08-22: qty 2

## 2022-08-22 MED ORDER — PANTOPRAZOLE SODIUM 40 MG PO TBEC
40.0000 mg | DELAYED_RELEASE_TABLET | Freq: Every day | ORAL | 0 refills | Status: AC
Start: 2022-08-22 — End: 2022-09-01

## 2022-08-22 MED ORDER — ONDANSETRON 4 MG PO TBDP
4.0000 mg | ORAL_TABLET | Freq: Four times a day (QID) | ORAL | 0 refills | Status: AC | PRN
Start: 2022-08-22 — End: ?

## 2022-08-22 MED ORDER — LACTATED RINGERS IV BOLUS
1000.0000 mL | Freq: Once | INTRAVENOUS | Status: AC
Start: 2022-08-22 — End: 2022-08-22
  Administered 2022-08-22: 1000 mL via INTRAVENOUS

## 2022-08-22 NOTE — ED Provider Notes (Signed)
I have briefly evaluated this patient as triage physician in order to facilitate and initiate the ordering of laboratory and imaging studies as needed.     Chief Complaint   Patient presents with    Diarrhea    Emesis    Nausea      Brief HPI:     61 year old male presents with nausea for 3 days.  Sent from urgent care.  Positive fevers and diarrhea    From patient first patient was tachycardic 118.  Strep negative flu COVID-negative White blood cell count was 12.  Potassium was 5.0 creatinine was 1.8.  Patient has nausea.  No abdominal pain.  No history of abdominal surgeries    There were no vitals filed for this visit.    Orders Placed This Encounter    CBC and differential    Comprehensive metabolic panel    Lipase    Urinalysis Reflex to Microscopic Exam- Reflex to Culture    sodium chloride 0.9 % bolus 1,000 mL    ondansetron (ZOFRAN) injection 4 mg          Harden Mo, MD     4:34 PM        Harden Mo, MD  08/22/22 [phone]

## 2022-08-22 NOTE — ED Notes (Signed)
Pt discharged to home. IV removed per protocol. VSS. Patient received discharge instructions. Pt understood to return if symptoms worsen. Pt educated and stated understanding.

## 2022-08-22 NOTE — ED Provider Notes (Signed)
History     Chief Complaint   Patient presents with    Diarrhea    Emesis    Nausea     61 year old who comes in with diarrhea nausea vomiting sore throat for several days.  Patient started having symptoms over the weekend thinks it started after something he ate.  Denies sick contacts.  Has tolerated small amounts of p.o. but states not much.  Has been having multiple episodes of diarrhea and several episodes of nausea and vomiting denies abdominal pain does have a sore throat.  Went to urgent care was found to have elevated white blood cell count and creatinine and sent here for evaluation.      Diarrhea  Associated symptoms: vomiting    Emesis  Associated symptoms: diarrhea         Past Medical History:   Diagnosis Date    Anemia     Arthritis     Difficulty walking     d/t ankle pain    Gastroesophageal reflux disease     Hiatal hernia     Hyperlipidemia     Hypertension     Myocardial infarction 2009    caused by trauma during cpr    Paroxysmal nocturnal dyspnea     d/t deflected nasal septum    Sleep apnea     sleep study done recently, currently not using a cpap    Type 2 diabetes mellitus, controlled     borderline; does not check glucose @ home       Past Surgical History:   Procedure Laterality Date    CARDIAC CATHETERIZATION  2008    COLONOSCOPY  09/05/2019    Dr. Lelon Huh. 2 small polyps, multiple sigmoid diverticula    ENDOSCOPY NASAL N/A 05/13/2016    Procedure: ENDOSCOPY NASAL;  Surgeon: Arnell Asal, MD;  Location: [facility] OR;  Service: ENT;  Laterality: N/A;  DISE, NASAL ENDOSCOPY, SEPTO, TURBS*         MOUTH SURGERY      SEPTOPLASTY SMR, TURBINATES N/A 05/13/2016    Procedure: SEPTOPLASTY SMR, TURBINATES;  Surgeon: Arnell Asal, MD;  Location: [facility] OR;  Service: ENT;  Laterality: N/A;       Family History   Adopted: Yes   Family history unknown: Yes       Social  Social History     Tobacco Use    Smoking status: Former     Packs/day: 0.50     Years: 30.00     Additional pack  years: 0.00     Total pack years: 15.00     Types: Cigarettes     Quit date: 11/01/2014     Years since quitting: 7.8    Smokeless tobacco: Never   Vaping Use    Vaping Use: Never used   Substance Use Topics    Alcohol use: Yes     Alcohol/week: 10.0 standard drinks of alcohol     Types: 10 Shots of liquor per week    Drug use: No       .     Allergies   Allergen Reactions    Statins Other (See Comments)     Joint aches    Penicillins Rash     Turned purple       Home Medications       Med List Status: In Progress Set By: Crista Elliot, RN at 08/22/2022  5:37 PM  albuterol (PROVENTIL) (2.5 MG/3ML) 0.083% nebulizer solution     Take 3 mLs (2.5 mg total) by nebulization every 4 (four) hours as needed for Wheezing     Aspirin Low Dose 81 MG EC tablet     TAKE 1 TABLET BY MOUTH  EVERY DAY     atorvastatin (LIPITOR) 20 MG tablet     TAKE 1 TABLET BY MOUTH  DAILY     fluocinonide (LIDEX) 0.05 % ointment     fluocinonide 0.05 % topical ointment     metFORMIN (GLUCOPHAGE-XR) 500 MG 24 hr tablet     TAKE 1 TABLET BY MOUTH  TWICE DAILY     metoprolol succinate XL (TOPROL-XL) 50 MG 24 hr tablet     TAKE 1 TABLET BY MOUTH  EVERY 24 HOURS     omeprazole (PriLOSEC) 20 MG capsule     TAKE 1 CAPSULE BY MOUTH  DAILY     tadalafil (CIALIS) 20 MG tablet     TAKE ONE TABLET BY MOUTH EVERY DAY 1 HOUR BEFORE NEEDED     valsartan-hydroCHLOROthiazide (DIOVAN-HCT) 320-25 MG per tablet     TAKE 1 TABLET BY MOUTH  DAILY     Ventolin HFA 108 (90 Base) MCG/ACT inhaler     INHALE 1 PUFF EVERY 4 TO 6 HOURS AS NEEDED COUGH             Review of Systems   Gastrointestinal:  Positive for diarrhea, nausea and vomiting.   All other systems reviewed and are negative.      Physical Exam    BP: 134/89, Heart Rate: (!) 122, Temp: 98.1 F (36.7 C), Resp Rate: 18, SpO2: 99 %, Weight: 101 kg    Physical Exam  Vitals and nursing note reviewed.   Constitutional:       Appearance: He is well-developed.   HENT:      Head: Normocephalic and  atraumatic.   Eyes:      General: Lids are normal.      Conjunctiva/sclera: Conjunctivae normal.   Cardiovascular:      Rate and Rhythm: Normal rate and regular rhythm.   Pulmonary:      Effort: Pulmonary effort is normal. No tachypnea or respiratory distress.   Abdominal:      General: Abdomen is flat. There is no distension.      Palpations: Abdomen is soft.      Tenderness: There is no abdominal tenderness.   Musculoskeletal:         General: No swelling. Normal range of motion.      Cervical back: Normal range of motion. No rigidity.   Skin:     General: Skin is warm and dry.   Neurological:      Mental Status: He is alert. Mental status is at baseline.   Psychiatric:         Attention and Perception: Attention and perception normal.         Mood and Affect: Mood normal.         Judgment: Judgment normal.           MDM and ED Course     ED Medication Orders (From admission, onward)      Start Ordered     Status Ordering Provider    08/22/22 2043 08/22/22 2043  iohexol (OMNIPAQUE) 350 MG/ML injection 100 mL  IMG once as needed        Route: Intravenous  Ordered Dose: 100 mL  Last MAR action: Imaging Agent Given Waldemar Dickens    08/22/22 1758 08/22/22 1757  lactated ringers bolus 1,000 mL  Once        Route: Intravenous  Ordered Dose: 1,000 mL       Last MAR action: Stopped Haynes Bast, Yesha Muchow H    08/22/22 1701 08/22/22 1700  pantoprazole (PROTONIX) injection 40 mg  Once        Route: Intravenous  Ordered Dose: 40 mg       Last MAR action: Given Waldemar Dickens    08/22/22 1635 08/22/22 1634  sodium chloride 0.9 % bolus 1,000 mL  Once        Route: Intravenous  Ordered Dose: 1,000 mL       Last MAR action: [employer], JOSEPH L    08/22/22 1635 08/22/22 1634  ondansetron (ZOFRAN) injection 4 mg  Once        Route: Intravenous  Ordered Dose: 4 mg       Last MAR action: Given [employer], [employer]               Medical Decision Making  MDM:    -JAKOBY MELENDREZ is a 61 year old who comes in with diarrhea nausea  vomiting sore throat for several days.  Patient started having symptoms over the weekend thinks it started after something he ate.  Denies sick contacts.  Has tolerated small amounts of p.o. but states not much.  Has been having multiple episodes of diarrhea and several episodes of nausea and vomiting denies abdominal pain does have a sore throat.  Went to urgent care was found to have elevated white blood cell count and creatinine and sent here for evaluation.    -Vitals tachycardic otherwise hemodynamically stable and afebrile. Pertinent PE findings include abdomen soft and nonperitoneal nontender in all quadrants.     -DDx includes but is not limited to dehydration versus gastroenteritis versus gastritis versus obstruction.     -Given initial history and PE findings CBC to assess for infection or anemia, CMP to assess renal pathology biliary pathology electro derangement, urinalysis to screen for urinary tract infection.  External record review of patient's urgent care visit was performed patient had white count of around 12 and creatinine reported to be 1.8.  Had negative flu COVID and strep at that time.  We will repeat the CBC and CMP will repeat swabs to ensure no viral infection or strep pharyngitis.  CT abdomen pelvis to assess for obstruction or other acute intra-abdominal pathology.  Zofran, Protonix and fluids and reassess for symptomatic management.  History obtained from patient as well as his spouse who is at the bedside providing supplemental history.Rolena Infante, MD  Emergency Medicine  08/22/2022 5:23 PM    This note may have been dictated on a word recognition program.  There may be word recognition mistakes that are occasionally missed on review.      Problems Addressed:  AKI (acute kidney injury): complicated acute illness or injury with systemic symptoms that poses a threat to life or bodily functions  Dehydration: complicated acute illness or injury with systemic symptoms that poses a  threat to life or bodily functions    Amount and/or Complexity of Data Reviewed  Independent Historian: spouse  External Data Reviewed: labs.     Details: Urgent care results  Labs: ordered. Decision-making details documented in ED Course.  Radiology: ordered. Decision-making details documented in ED Course.  ECG/medicine tests: ordered and  independent interpretation performed. Decision-making details documented in ED Course.    Risk  Prescription drug management.          ED Course as of 08/22/22 2100   Mon Aug 22, 2022   1728 ECG 12 Lead  Interpreted independently by myself in the absence of a cardiologist.  Sinus tachycardia ventricular rate of 115 bpm, normal axis, good R wave progression, no evidence of acute infarction, QTc 450 [RK]   1801 COVID-19 (SARS-CoV-2) and Influenza A/B, NAA ([place]iat Rapid)  negative [RK]   1801 Urinalysis Reflex to Microscopic Exam- Reflex to Culture(!)  dehydration [RK]   1801 Comprehensive metabolic panel(!)  Dehydration mild aki [RK]   2012 Heart Rate: 97  Resolved with fluids [RK]   2012 GROUP A STREP, RAPID ANTIGEN  negative [RK]   2057 Basic Metabolic Panel(!)  AKI resolved [RK]   2059 CT Abd/ Pelvis with IV Contrast  No acute abnormality seen in abdomen and pelvis. [RK]      ED Course User Index  [RK] Waldemar Dickens, MD       Pt able to ambulate without assistance and with a steady gait. On exam pt did not have have any focal neuro deficits or nystagmus noted. Pt is tolerating PO. Pt is not slurring speech, follows directions and demonstrates capacity to be safely discharged to self care.  The patient's workup in the Emergency Department does not suggest emergent or life-threatening condition requiring admission or immediate specialist consultation.    All imaging studies and lab results, when applicable, were discussed a length with the patient.    Precautions were discussed including; worsening symptoms, or the development of new concerning symptoms in the next 12-24 hours  that warrants a return to the emergency department for re-evaluation.    The patient has been instructed to follow up with their primary care provider in less than 48 hours and, when applicable, specialists for which contact and referral information has been provided.    All questions were answered and concerns addressed prior to discharge.They understand and are amenable to discharge and to this plan.        Procedures    Clinical Impression & Disposition     Clinical Impression  Final diagnoses:   Dehydration   Nausea and vomiting, unspecified vomiting type   AKI (acute kidney injury)   Gastroenteritis        ED Disposition       ED Disposition   Discharge    Condition   --    Date/Time   Mon Aug 22, 2022  9:00 PM    Comment   TAVONTE SEYBOLD discharge to home/self care.    Condition at disposition: Stable                  New Prescriptions    ONDANSETRON (ZOFRAN-ODT) 4 MG DISINTEGRATING TABLET    Take 1 tablet (4 mg) by mouth every 6 (six) hours as needed for Nausea    PANTOPRAZOLE (PROTONIX) 40 MG TABLET    Take 1 tablet (40 mg) by mouth daily for 10 days                   Waldemar Dickens, MD  08/22/22 2100

## 2022-08-22 NOTE — ED Notes (Signed)
Manny EMT performed EKG with portable machine in H7, received critical STEMI result. This tech presented EKG immediately to Dr. Yetta Barre who was standing nearby. Dr. Yetta Barre instructed [place] to do a repeat EKG in room. This tech had to acquire another set of wires as the monitor would not pick up on rhythm. Working with Berneice Heinrich EMT, Angie RN, and this tech, we got the EKG to work. Presented to both Dr. Yetta Barre and Dr. Haynes Bast. Per Dr. Yetta Barre, no STEMI.

## 2022-08-22 NOTE — Discharge Instructions (Addendum)
Thank you for choosing [facility] for your emergency care needs.   We strive to provide EXCELLENT care to you and your family.     IF YOU DO NOT CONTINUE TO IMPROVE OR YOUR CONDITION WORSENS, PLEASE CONTACT YOUR DOCTOR OR RETURN IMMEDIATELY TO THE EMERGENCY DEPARTMENT.     Call your primary care physician to make an appointment. If you do not have one, see below for information on scheduling an appointment with a primary care provider.    Take all your medications exactly as prescribed.     Prolonged or overuse of drugs prescribed for pain or sedation may lead to addiction, dependence, and other complications.     RETURN TO THE ER if any of the following occur:   New or worse pain: if it feels different, becomes more severe, lasts longer, or begins to spread   Shortness of breath or increased pain with breathing   Cough with dark colored sputum (phlegm) or blood   Weakness, dizziness, fainting, falling out, or loss of consciousness   Fever of 100.4?F (38?C) or higher   Any new or concerning symptoms     ORTHOPEDIC INJURY   If you came to the ER for an Orthopedic related injury, please know that significant injuries can exist even when an initial x-ray is read as normal or negative. This can occur because some fractures (broken bones) are not initially visible on x-rays. For this reason, close outpatient follow-up with your primary care doctor or bone specialist (orthopedist) is required.     MEDICATIONS AND FOLLOWUP   Please be aware that some prescription medications can cause drowsiness. Use caution when driving or operating machinery.   The examination and treatment you have received in our Emergency Department is provided on an emergency basis, and is not intended to be a substitute for your primary care physician. It is important that your doctor checks you again and that you report any new or remaining problems at that time.     The following resources may be helpful if you do not have health  insurance:  https://www.healthcare.gov/ (877) 621-9399     Run by the federal government, this site offers a comparison tool, answers to frequently asked questions and the opportunity to enroll in insurance through the exchange. You may qualify for significant subsidies for your premiums.    Obtaining a Primary Care Appointment    Primary care physicians (PCPs, also known as primary care doctors) are either internists or family medicine doctors. Both types of PCPs focus on health promotion, disease prevention, patient education and counseling, and treatment of acute and chronic medical conditions.    Call for an appointment with a primary care doctor.  Ask to see who is taking new patients. Several options are available with nearby offices.    [facility]  telephone:  [phone]  [email or web address]     [facility]  telephone:  [phone]  [email or web address]    [facility]  [email or web address]    [facility]  [email or web address]      DOCTOR REFERRALS   Call [phone] (available 24 hours a day, 7 days a week) if you need any further referrals and we can help you find a primary care doctor or specialist. Also, available online at: [email or web address]   YOUR CONTACT INFORMATION   Before leaving please check with registration to make sure we have an up-to-date contact number. You can call registration at [phone] to update your information. For questions about your   hospital bill, please call [phone]. For questions about your Emergency Dept Physician bill please call [phone].   FREE HEALTH SERVICES   If you need help with health or social services, please call 2-1-1 for a free referral to resources in your area. 2-1-1 is a free service connecting people with information on health insurance, free clinics, pregnancy, mental health, dental care, food assistance, housing, and substance abuse counseling. Also, available  online at: http://www.[facility].org   MEDICAL RECORDS AND TESTS   Certain laboratory test results do not come back the same day, for example urine cultures. We will contact you if other important findings are noted. Radiology films are often reviewed again to ensure accuracy. If there is any discrepancy, we will notify you.   Please call [phone] to pick up a complimentary CD of any radiology studies performed. If you or your doctor would like to request a copy of your medical records, please call [phone].     24 H[facility] PHARMACIES  [facility] - [address], , [place] [address] (1.4 miles, 7 minutes)   [facility] - [address], [place],  [address] (6.5 miles, 13 minutes)   Handout with directions available on request.

## 2022-08-23 LAB — ECG 12-LEAD
P Axis: 68 degrees
P-R Interval: 174 ms
Q-T Interval: 318 ms
QTC Calculation (Bezet): 442 ms
R Axis: 67 degrees
T Axis: 74 degrees
Ventricular Rate: 116 {beats}/min

## 2022-08-31 IMAGING — MR MULTI PARAMETRIC MRI PROSTATE W/O AND W
13 series · 48 of 48 positions shown · IV contrast (gadavist)
Comparison: None.

________________________________________________________________________________________________ 
MULTI PARAMETRIC MRI PROSTATE W/O AND W, 08/31/2022 [DATE]: 
CLINICAL INDICATION:  Elevated PSA.
TECHNIQUE: Multiple parametric sequences were performed. Patient was scanned on 
a 3T magnet. 
Pre-contrast: T1 axial of the entire pelvis. T2 sagittal, axial and coronal, T1 
axial, acquired of the prostate. Diffusion with multiple B values of 9333, 6566 
calculated ADC value for mapping.  
Post contrast: Rapid sequence dynamic and axial planes through the prostate and 
seminal vesicles, T1 axial with fat sat of the entire pelvis. 3-D renderings 
were reconstructed on an independent workstation. The images were also evaluated 
with Dyna CAD computer aided detection. 10.0 of Gadavist were injected 
intravenously. 0 mL of Gadavist was discarded. As per [HOSPITAL] guidelines 3-D reconstructions are performed with concurrent physician 
supervision.

[Series 101: survey-mst · axial · 10.0mm · 1.34mm/px · 1 of 14 slices shown]
[im 1/14]
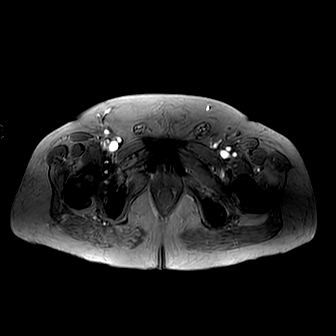

[Series 201: t1w_tse_ax · axial · 6.0mm · 0.37mm/px · 1 of 36 slices shown]
[im 1/36]
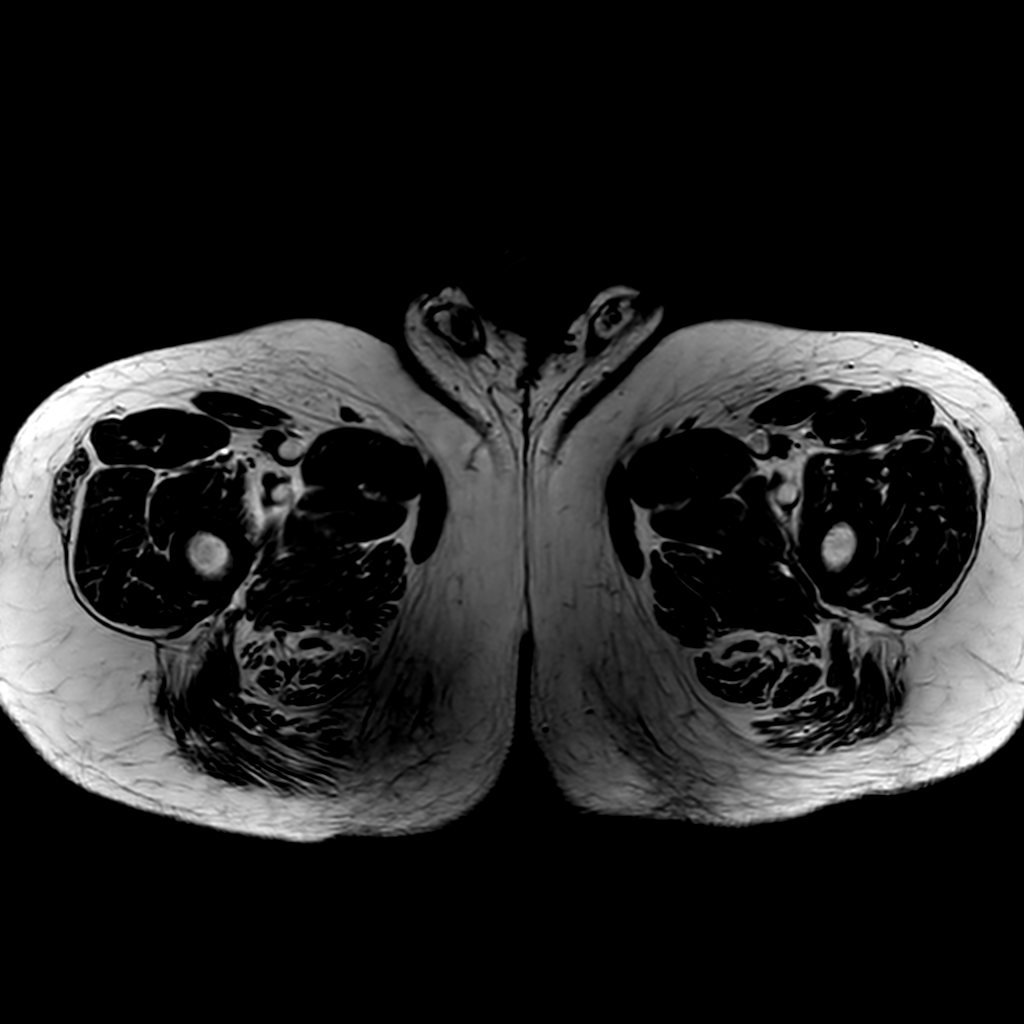

[Series 301: t2w sag · sagittal · 3.0mm · 0.45mm/px · 1 of 30 slices shown]
[im 1/30]
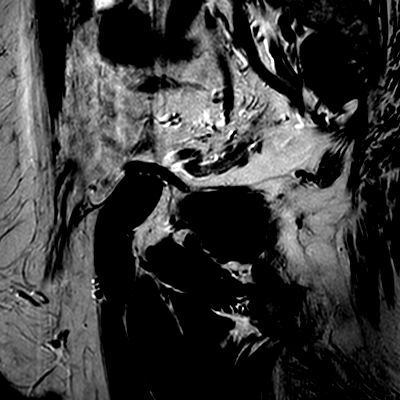

[Series 401: t2w cor · coronal · 3.0mm · 0.42mm/px · 1 of 30 slices shown]
[im 1/30]
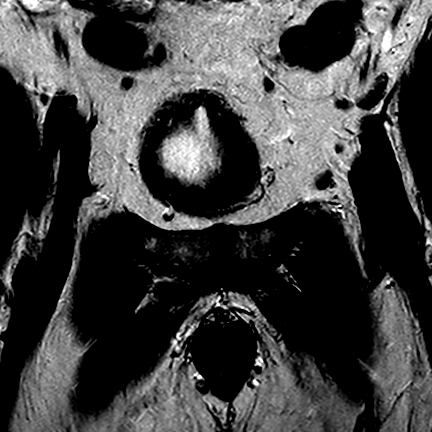

[Series 501: t2w ax · axial · 3.0mm · 0.38mm/px · 1 of 32 slices shown]
[im 1/32]
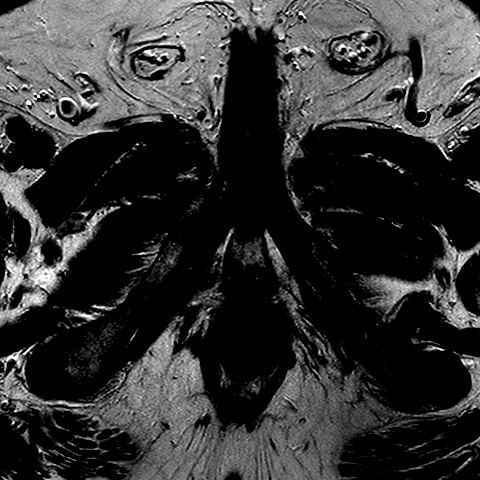

[Series 601: new-dwi_3b* 3mm* · axial · 3.0mm · 1.28mm/px · 1 of 64 slices shown]
[im 1/64]
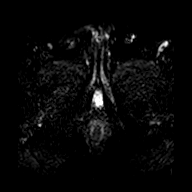

[Series 602: ADC · axial · 3.0mm · 1.28mm/px · 1 of 32 slices shown (1 of 2)]
[im 1/32]
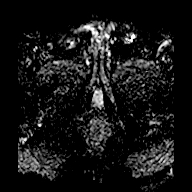

[Series 603: ADC · axial · 3.0mm · 1.28mm/px · 1 of 31 slices shown (2 of 2)]
[im 1/31]
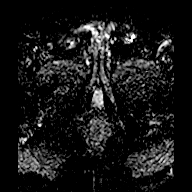

[Series 604: (id) · axial · 3.0mm · 1.28mm/px · 1 of 32 slices shown (1 of 3)]
[im 1/32]
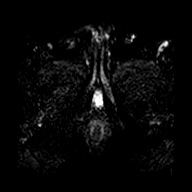

[Series 605: (id) · axial · 3.0mm · 1.28mm/px · 1 of 32 slices shown (2 of 3)]
[im 1/32]
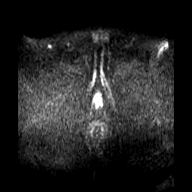

[Series 701: (id) · axial · 3.0mm · 1.28mm/px · 1 of 32 slices shown (3 of 3)]
[im 1/32]
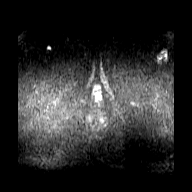

[Series 901: dyn 3mm*(ap) · axial · 3.0mm · 1.38mm/px · z∈[-42,+51]mm · 36 of 1440 slices shown]
[im 1/1440]
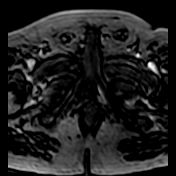
[im 42/1440]
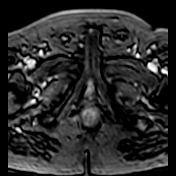
[im 83/1440]
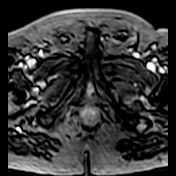
[im 124/1440]
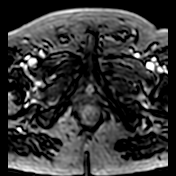
[im 165/1440]
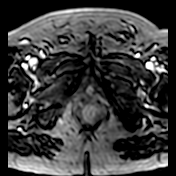
[im 206/1440]
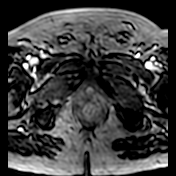
[im 247/1440]
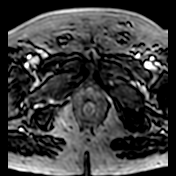
[im 288/1440]
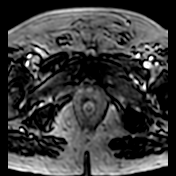
[im 329/1440]
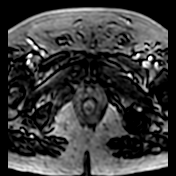
[im 371/1440]
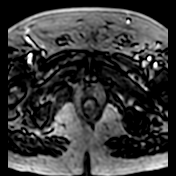
[im 412/1440]
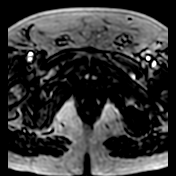
[im 453/1440]
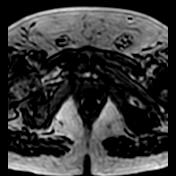
[im 494/1440]
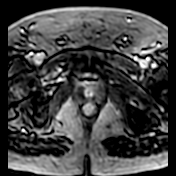
[im 535/1440]
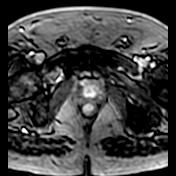
[im 576/1440]
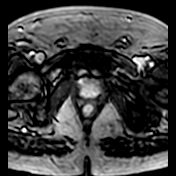
[im 617/1440]
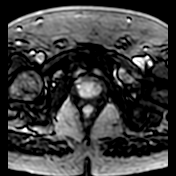
[im 658/1440]
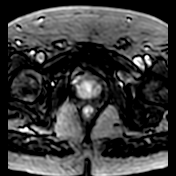
[im 699/1440]
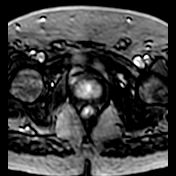
[im 741/1440]
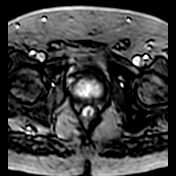
[im 782/1440]
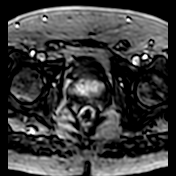
[im 823/1440]
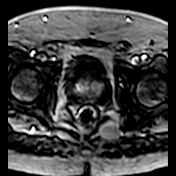
[im 864/1440]
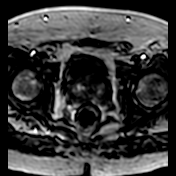
[im 905/1440]
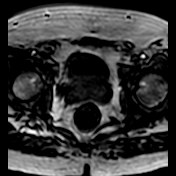
[im 946/1440]
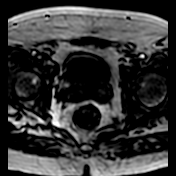
[im 987/1440]
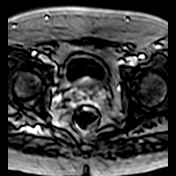
[im 1028/1440]
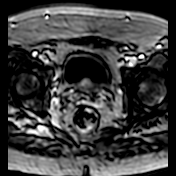
[im 1069/1440]
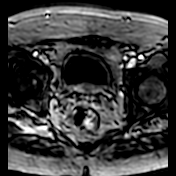
[im 1111/1440]
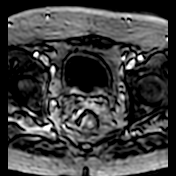
[im 1152/1440]
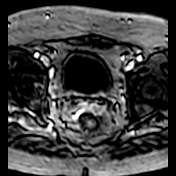
[im 1193/1440]
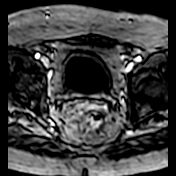
[im 1234/1440]
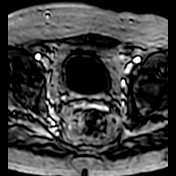
[im 1275/1440]
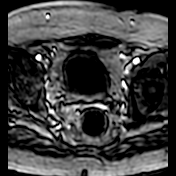
[im 1316/1440]
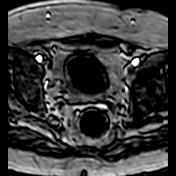
[im 1357/1440]
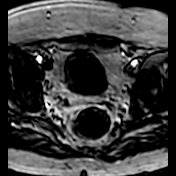
[im 1398/1440]
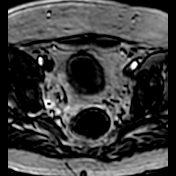
[im 1440/1440]
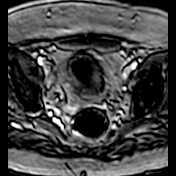

[Series 1002: DIXON · axial · 6.0mm · 0.49mm/px · 1 of 36 slices shown]
[im 1/36]
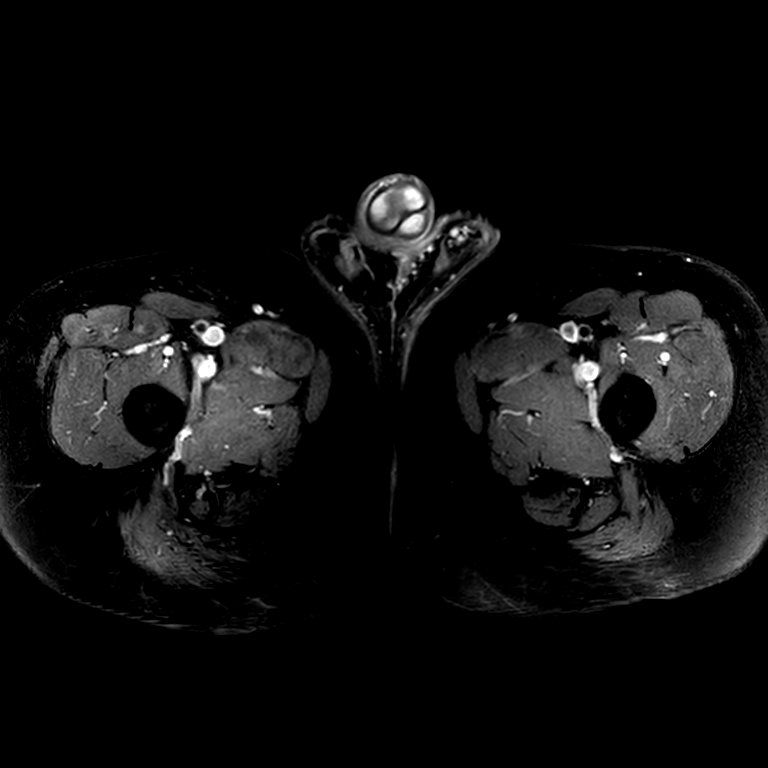

[48 of 48 positions shown; findings below may reference images not displayed]

FINDINGS: VOLUME: The prostate measures 6.2 x 4.3 x 4.0 cm with a volume of 53 cc. 
CENTRAL GLAND: There is mild hyperplasia of the central gland without suspicious 
findings. 
PERIPHERAL ZONE: No suspicious peripheral zone lesions are found. There are no 
foci of restricted diffusion in the prostate. 
PROSTATE CAPSULE: Normal. 
MUSCLE SIDE WALLS: Normal. 
SEMINAL VESICLES: Normal. 
BLADDER: Unremarkable. 
LYMPHADENOPATHY: No pathologically enlarged pelvic lymph nodes are present. 
BONES: No suspicious skeletal lesions in the pelvic girdle. 
ADDITIONAL FINDINGS: Uncomplicated sigmoid diverticulosis.
IMPRESSION: No MRI evidence for clinically significant prostatic malignancy. 
(PI-RADS 2): Low (clinically significant cancer is unlikely to be present).
